# Patient Record
Sex: Male | Born: 1950
Health system: Southern US, Community
[De-identification: ages and names within clinical notes are randomized; demographics above are authoritative.]

## PROBLEM LIST (undated history)

## (undated) DIAGNOSIS — K5790 Diverticulosis of intestine, part unspecified, without perforation or abscess without bleeding: Secondary | ICD-10-CM

## (undated) DIAGNOSIS — I10 Essential (primary) hypertension: Secondary | ICD-10-CM

## (undated) DIAGNOSIS — I639 Cerebral infarction, unspecified: Secondary | ICD-10-CM

## (undated) DIAGNOSIS — K648 Other hemorrhoids: Secondary | ICD-10-CM

## (undated) DIAGNOSIS — E559 Vitamin D deficiency, unspecified: Secondary | ICD-10-CM

## (undated) DIAGNOSIS — E785 Hyperlipidemia, unspecified: Secondary | ICD-10-CM

## (undated) HISTORY — DX: Essential (primary) hypertension: I10

## (undated) HISTORY — PX: NASAL SEPTUM SURGERY: SHX37

## (undated) HISTORY — DX: Vitamin D deficiency, unspecified: E55.9

## (undated) HISTORY — PX: INGUINAL HERNIA REPAIR: SUR1180

## (undated) HISTORY — PX: TONSILLECTOMY: SUR1361

## (undated) HISTORY — DX: Diverticulosis of intestine, part unspecified, without perforation or abscess without bleeding: K57.90

## (undated) HISTORY — DX: Other hemorrhoids: K64.8

## (undated) HISTORY — DX: Hyperlipidemia, unspecified: E78.5

---
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
## 2006-08-28 HISTORY — PX: COLONOSCOPY: SHX174

## 2006-11-14 ENCOUNTER — Ambulatory Visit: Payer: Self-pay | Admitting: Gastroenterology

## 2006-11-28 ENCOUNTER — Ambulatory Visit: Payer: Self-pay | Admitting: Gastroenterology

## 2006-11-28 LAB — HM COLONOSCOPY

## 2010-11-25 ENCOUNTER — Telehealth: Payer: Self-pay | Admitting: Gastroenterology

## 2010-11-25 NOTE — Telephone Encounter (Signed)
Attempted to call pt back, phone number listed states it has been disconnected.

## 2010-11-28 ENCOUNTER — Telehealth: Payer: Self-pay | Admitting: Gastroenterology

## 2010-11-28 NOTE — Telephone Encounter (Signed)
I spoke with the patient and he believes he is due for a colon in 2013.  He wants to schedule now because he also has a hemorrhoid he would like to have surgically removed.   I  did explain to him that according to his procedure report from 11/2006, he would not be due for a colon until 2018.  He has no family hx of colon CA, no rectal bleeding or change in bowel habits.  He wanted to just then schedule a hemorrhoid removal.  I  explained that he would need to see a surgeon for this and not Dr Russella Dar. Dr Russella Dar I have placed a copy of the the 2008 colon report in your office please review and advise if different.

## 2010-11-29 NOTE — Telephone Encounter (Signed)
He can schedule an office visit to further evaluate his hemorrhoids with [place] or general surgery.

## 2010-11-29 NOTE — Telephone Encounter (Signed)
I spoke he declined to schedule at this time he will call back if he wants to schedule an appointment

## 2011-03-28 ENCOUNTER — Encounter: Payer: Self-pay | Admitting: Neurosurgery

## 2011-04-10 ENCOUNTER — Telehealth: Payer: Self-pay | Admitting: Gastroenterology

## 2011-04-10 NOTE — Telephone Encounter (Signed)
Left message for patient to call back  

## 2011-04-10 NOTE — Telephone Encounter (Signed)
Questions about hemorrhoids answered for the patient he will call back if he is interested in scheduling an office visit.

## 2011-04-21 ENCOUNTER — Other Ambulatory Visit: Payer: Self-pay | Admitting: Gastroenterology

## 2011-05-08 ENCOUNTER — Ambulatory Visit: Payer: Self-pay | Admitting: Gastroenterology

## 2012-07-04 ENCOUNTER — Telehealth: Payer: Self-pay | Admitting: Gastroenterology

## 2012-07-04 NOTE — Telephone Encounter (Signed)
All questions answered

## 2013-09-02 DIAGNOSIS — E785 Hyperlipidemia, unspecified: Secondary | ICD-10-CM | POA: Insufficient documentation

## 2013-09-02 DIAGNOSIS — I10 Essential (primary) hypertension: Secondary | ICD-10-CM | POA: Insufficient documentation

## 2013-09-02 DIAGNOSIS — E559 Vitamin D deficiency, unspecified: Secondary | ICD-10-CM | POA: Insufficient documentation

## 2013-09-03 ENCOUNTER — Ambulatory Visit (INDEPENDENT_AMBULATORY_CARE_PROVIDER_SITE_OTHER): Payer: BC Managed Care – PPO | Admitting: Emergency Medicine

## 2013-09-03 VITALS — BP 178/100 | HR 62 | Temp 98.0°F | Resp 20 | Ht 69.5 in | Wt 163.0 lb

## 2013-09-03 DIAGNOSIS — K469 Unspecified abdominal hernia without obstruction or gangrene: Secondary | ICD-10-CM

## 2013-09-03 DIAGNOSIS — E782 Mixed hyperlipidemia: Secondary | ICD-10-CM

## 2013-09-03 DIAGNOSIS — I10 Essential (primary) hypertension: Secondary | ICD-10-CM

## 2013-09-03 LAB — HEPATIC FUNCTION PANEL
ALBUMIN: 4.2 g/dL (ref 3.5–5.2)
ALT: 10 U/L (ref 0–53)
AST: 13 U/L (ref 0–37)
Alkaline Phosphatase: 67 U/L (ref 39–117)
BILIRUBIN TOTAL: 0.6 mg/dL (ref 0.3–1.2)
Bilirubin, Direct: 0.1 mg/dL (ref 0.0–0.3)
Indirect Bilirubin: 0.5 mg/dL (ref 0.0–0.9)
Total Protein: 6.4 g/dL (ref 6.0–8.3)

## 2013-09-03 LAB — CBC WITH DIFFERENTIAL/PLATELET
BASOS PCT: 0 % (ref 0–1)
Basophils Absolute: 0 10*3/uL (ref 0.0–0.1)
EOS PCT: 3 % (ref 0–5)
Eosinophils Absolute: 0.1 10*3/uL (ref 0.0–0.7)
HCT: 40.8 % (ref 39.0–52.0)
HEMOGLOBIN: 14.1 g/dL (ref 13.0–17.0)
Lymphocytes Relative: 19 % (ref 12–46)
Lymphs Abs: 0.9 10*3/uL (ref 0.7–4.0)
MCH: 30.5 pg (ref 26.0–34.0)
MCHC: 34.6 g/dL (ref 30.0–36.0)
MCV: 88.3 fL (ref 78.0–100.0)
MONO ABS: 0.4 10*3/uL (ref 0.1–1.0)
MONOS PCT: 8 % (ref 3–12)
NEUTROS PCT: 70 % (ref 43–77)
Neutro Abs: 3.4 10*3/uL (ref 1.7–7.7)
Platelets: 209 10*3/uL (ref 150–400)
RBC: 4.62 MIL/uL (ref 4.22–5.81)
RDW: 14 % (ref 11.5–15.5)
WBC: 4.8 10*3/uL (ref 4.0–10.5)

## 2013-09-03 LAB — LIPID PANEL
CHOL/HDL RATIO: 3.5 ratio
Cholesterol: 183 mg/dL (ref 0–200)
HDL: 53 mg/dL (ref >39)
LDL Cholesterol: 106 mg/dL — ABNORMAL HIGH (ref 0–99)
Triglycerides: 122 mg/dL (ref <150)
VLDL: 24 mg/dL (ref 0–40)

## 2013-09-03 LAB — BASIC METABOLIC PANEL WITH GFR
BUN: 13 mg/dL (ref 6–23)
CO2: 28 mEq/L (ref 19–32)
Calcium: 9.4 mg/dL (ref 8.4–10.5)
Chloride: 102 mEq/L (ref 96–112)
Creat: 0.88 mg/dL (ref 0.50–1.35)
GFR, Est Non African American: >89 mL/min
GLUCOSE: 86 mg/dL (ref 70–99)
POTASSIUM: 3.9 meq/L (ref 3.5–5.3)
Sodium: 137 mEq/L (ref 135–145)

## 2013-09-03 NOTE — Patient Instructions (Signed)
Hernia A hernia occurs when an internal organ pushes out through a weak spot in the abdominal wall. Hernias most commonly occur in the groin and around the navel. Hernias often can be pushed back into place (reduced). Most hernias tend to get worse over time. Some abdominal hernias can get stuck in the opening (irreducible or incarcerated hernia) and cannot be reduced. An irreducible abdominal hernia which is tightly squeezed into the opening is at risk for impaired blood supply (strangulated hernia). A strangulated hernia is a medical emergency. Because of the risk for an irreducible or strangulated hernia, surgery may be recommended to repair a hernia. CAUSES   Heavy lifting.  Prolonged coughing.  Straining to have a bowel movement.  A cut (incision) made during an abdominal surgery. HOME CARE INSTRUCTIONS   Bed rest is not required. You may continue your normal activities.  Avoid lifting more than 10 pounds (4.5 kg) or straining.  Cough gently. If you are a smoker it is best to stop. Even the best hernia repair can break down with the continual strain of coughing. Even if you do not have your hernia repaired, a cough will continue to aggravate the problem.  Do not wear anything tight over your hernia. Do not try to keep it in with an outside bandage or truss. These can damage abdominal contents if they are trapped within the hernia sac.  Eat a normal diet.  Avoid constipation. Straining over long periods of time will increase hernia size and encourage breakdown of repairs. If you cannot do this with diet alone, stool softeners may be used. SEEK IMMEDIATE MEDICAL CARE IF:   You have a fever.  You develop increasing abdominal pain.  You feel nauseous or vomit.  Your hernia is stuck outside the abdomen, looks discolored, feels hard, or is tender.  You have any changes in your bowel habits or in the hernia that are unusual for you.  You have increased pain or swelling around the  hernia.  You cannot push the hernia back in place by applying gentle pressure while lying down. MAKE SURE YOU:   Understand these instructions.  Will watch your condition.  Will get help right away if you are not doing well or get worse. Document Released: 08/14/2005 Document Revised: 11/06/2011 Document Reviewed: 04/02/2008 ExitCare Patient Information 2014 [employer].  

## 2013-09-03 NOTE — Progress Notes (Signed)
   Subjective:    Patient ID: [name], male    DOB: 01-09-1951, 63 y.o.   MRN: [id number]  HPI Comments: 63 yo male notes BP before coffee is 125/70s. He had 2 cups of coffee today. LAST LABS T 182 TG 69 L 111 D 50 he is exercising routinely. He eats healthy.   Patient has history of inguinal hernia that is doing well but now feels he may have another hernia around umbilicus after intense abdominal workout. He notes massage and rest have helped some. He notes all of abdomen was initially sore after work out but also noticed small bulge. He notes he is able to make bulge resolve with massage.  Current Outpatient Prescriptions on File Prior to Visit  Medication Sig Dispense Refill  . amLODipine (NORVASC) 5 MG tablet Take 5 mg by mouth daily.      . B Complex Vitamins (VITAMIN B COMPLEX PO) Take by mouth.      . carvedilol (COREG) 25 MG tablet Take 25 mg by mouth 2 (two) times daily with a meal.      . enalapril (VASOTEC) 20 MG tablet Take 20 mg by mouth daily. Taking 1/2      . vitamin C (ASCORBIC ACID) 500 MG tablet Take 500 mg by mouth daily.      Marland Kitchen VITAMIN E PO Take by mouth daily.       No current facility-administered medications on file prior to visit.   ALLERGIES Penicillins  Past Medical History  Diagnosis Date  . Diverticulosis   . Internal hemorrhoid   . Hyperlipidemia   . Hypertension   . Vitamin D deficiency       Review of Systems  Gastrointestinal: Positive for abdominal pain.  All other systems reviewed and are negative.   BP 178/100  Pulse 62  Temp(Src) 98 F (36.7 C) (Temporal)  Resp 20  Ht 5' 9.5" (1.765 m)  Wt 163 lb (73.936 kg)  BMI 23.73 kg/m2     Objective:   Physical Exam  Nursing note and vitals reviewed. Constitutional: He is oriented to person, place, and time. He appears well-developed and well-nourished.  HENT:  Head: Normocephalic and atraumatic.  Right Ear: External ear normal.  Left Ear: External ear normal.  Nose: Nose normal.   Eyes: Conjunctivae and EOM are normal.  Neck: Normal range of motion. Neck supple. No JVD present. No thyromegaly present.  Cardiovascular: Normal rate, regular rhythm, normal heart sounds and intact distal pulses.   Pulmonary/Chest: Effort normal and breath sounds normal.  Abdominal: Soft. Bowel sounds are normal. He exhibits no distension and no mass. There is no tenderness. There is no rebound and no guarding.  No obvious umbilical hernia, left inguinal small hernia easily reducible on exam  Musculoskeletal: Normal range of motion. He exhibits no edema and no tenderness.  Lymphadenopathy:    He has no cervical adenopathy.  Neurological: He is alert and oriented to person, place, and time. He has normal reflexes. No cranial nerve deficit. Coordination normal.  Skin: Skin is warm and dry.  Psychiatric: He has a normal mood and affect. His behavior is normal. Judgment and thought content normal.      Recheck 140/80    Assessment & Plan:  1.  3 month F/U for HTN, Cholesterol. Needs healthy diet, cardio QD and obtain healthy weight. Check Labs, Check BP if >130/80 call  2. ? Hernia- explained hygiene/ symptoms to monitor w/c with any problems/ concerns

## 2013-09-07 ENCOUNTER — Encounter: Payer: Self-pay | Admitting: Emergency Medicine

## 2013-09-16 ENCOUNTER — Telehealth: Payer: Self-pay | Admitting: *Deleted

## 2013-09-16 NOTE — Telephone Encounter (Signed)
Patient called and left message with front staff stating concerns with BP Rx's.  He c/o feeling increased fatigue and decreased pulse rate, but unsure which Rx causing symptoms.  Per orders by Kelby Aline, PA-C I advised patient to continue Rx's the same except to decrease Carvediol by 1/2 and take in the evenings.  Advised patient to call with results in one week, but follow up before then if needed.

## 2013-09-18 ENCOUNTER — Telehealth: Payer: Self-pay | Admitting: Internal Medicine

## 2013-09-18 NOTE — Telephone Encounter (Signed)
PT CALLED WITH QUESTIONS ON LABS. I TOOK A MESSAGE. CALLED PT TO [facility] TO DISCUSS QUESTIONS, AND PT ASKED IF PSA TEST WAS DONE, IF SO WHAT ARE THE RESULTS? PLEASE ADVISE PT

## 2013-09-26 ENCOUNTER — Telehealth: Payer: Self-pay | Admitting: Internal Medicine

## 2013-09-26 ENCOUNTER — Telehealth: Payer: Self-pay

## 2013-09-26 NOTE — Telephone Encounter (Signed)
Pt called c/o being weak, no energy, chills, runny nose. No fever, nausea, or body aches. 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 He didn't want to wait until Mon to call. Please advise. [facility]

## 2013-09-26 NOTE — Telephone Encounter (Signed)
Thank you for calling and advising him to push fluids and try OTC theraflu. Urgent care/ ER if increased symptoms.

## 2013-09-26 NOTE — Telephone Encounter (Signed)
PATIENT CALLED AFTER HOURS TO MELISSA SMITH,P.A. REQUESTING TAMIFLU.  I CALLED PATIENT PER MISS SMITH AND ADVISED PATIENT TO INTAKE MORE FLUIDS AND TRY THERAFLU.  IF SYMTOMS PRESIST GO TO A MINUTE CLINIC OVER THE WEEKEND, IF HE CAN NOT WAIT UNTIL Monday.

## 2013-10-25 ENCOUNTER — Other Ambulatory Visit: Payer: Self-pay | Admitting: Internal Medicine

## 2014-01-15 ENCOUNTER — Other Ambulatory Visit: Payer: Self-pay | Admitting: Internal Medicine

## 2014-03-10 ENCOUNTER — Encounter: Payer: Self-pay | Admitting: Emergency Medicine

## 2014-03-10 ENCOUNTER — Ambulatory Visit (INDEPENDENT_AMBULATORY_CARE_PROVIDER_SITE_OTHER): Payer: BC Managed Care – PPO | Admitting: Emergency Medicine

## 2014-03-10 VITALS — BP 140/82 | HR 62 | Temp 97.8°F | Resp 16 | Ht 69.5 in | Wt 166.0 lb

## 2014-03-10 DIAGNOSIS — I10 Essential (primary) hypertension: Secondary | ICD-10-CM

## 2014-03-10 NOTE — Patient Instructions (Signed)
Squamous Cell Carcinoma  Squamous cell carcinoma is the second most common form of skin cancer. It begins in the squamous cells in the outer layer of the skin (epidermis).  CAUSES  Ultraviolet light exposure is the most common cause of squamous cell carcinoma. This may come from sunlight or tanning beds. Squamous cell carcinoma is most common in sun-exposed areas like the face, neck, arms, and hands. However, squamous cell carcinoma can occur anywhere on the body, including the lips, inside the mouth, the legs, sites of long-term (chronic) scarring, and the anus.  Other causes of squamous cell carcinoma can include:  Exposure to arsenic.  Exposure to radiation.  Exposure to toxic tars and oils. RISK FACTORS Factors that increase your risk for squamous cell carcinoma include:  Having fair skin.  Being middle-aged or elderly.  Heavy sun exposure, especially during childhood.  Repeated sunburns.  Use of tanning beds.  A weakened immune system. This includes patients who have received a transplant and patients with human immunodeficiency virus (HIV) or acquired immunodeficency syndrome (AIDS).  Human papillomavirus infection.  Conditions that cause chronic scarring. This can include burn scars, chronic ulcers, heat (thermal) injuries, and radiation.  Exposure to psoralen plus ultraviolet A light therapy.  Exposure to chemical carcinogens, such as tar, soot, and arsenic.  Chronic, inflammatory conditions such as lupus, lichen planus, or lichen sclerosus.  Chronic infections, such as infections of the bone (osteomyelitis).  Smoking. SYMPTOMS  Squamous cell carcinoma often starts as small, skin-colored (pink or brown) sandpaper-like growths. These growths are called solar keratoses or actinic keratoses. These growths are often more easily felt than seen.  DIAGNOSIS  Your caregiver may be able to tell what is wrong by doing a physical exam. Often, a tissue sample is also taken. The  tissue sample is examined under a microscope.  TREATMENT  The treatment for squamous cell carcinoma depends on the size and location of the tumors, as well as your overall health. Possible treatments include:   Mohs surgery. This is a procedure done by a skin doctor (dermatologist or Mohs surgeon) in his or her office. The cancerous cells are removed layer by layer.  Laser surgery to remove the tumor.  Freezing the tumor with liquid nitrogen (cryosurgery).  Radiation. This may be used for tumors on the face.  Electrodesiccation and curettage. This involves alternately scraping and burning the tumor, using an electric current to control bleeding. If treated soon enough, squamous cell carcinoma rarely spreads to other areas of the body (metastasizes). If left untreated, however, squamous cell carcinoma will destroy the nearby tissues. This can result in the loss of a nose or ear. PREVENTION  Avoid the sun between 10:00 am and 4:00 pm when it is the strongest.  Use a sunscreen or sunblock with sun protection factor 30 or greater.  Apply sunscreen at least 30 minutes before exposure to the sun.  Reapply sunscreen every 2 to 4 hours while you are outside, after swimming, and after excessive sweating.  Always wear protective hats, clothing, and sunglasses with ultraviolet protection.  Avoid tanning beds. HOME CARE INSTRUCTIONS   Avoid unprotected sun exposure.  Do not smoke.  Follow your caregiver's instructions for self-exams. Look for new growths or changes in your skin.  Keep all follow-up appointments as directed by your caregiver. SEEK MEDICAL CARE IF:   You notice any new growths or changes in your skin.  You have had a squamous cell carcinoma tumor removed and you notice a new growth in   your skin.  · Keep all follow-up appointments as directed by your caregiver.  SEEK MEDICAL CARE IF:   · You notice any new growths or changes in your skin.  · You have had a squamous cell carcinoma tumor removed and you notice a new growth in the same location.  Document Released: 02/18/2003 Document Revised: 11/06/2011 Document Reviewed: 05/08/2011  ExitCare® Patient Information ©2015 [employer]. This information is not  intended to replace advice given to you by your health care provider. Make sure you discuss any questions you have with your health care provider.

## 2014-03-10 NOTE — Progress Notes (Signed)
Subjective:    Patient ID: [name], male    DOB: 24-Oct-1950, 63 y.o.   MRN: [id number]  HPI Comments: 63 yo WM presents for 3 month F/U for HTN,  He is exercising routinely. He is eating healthy for the most part. He has gained some weight. He notes BP good at home. He has CPE in August.  He has mole on right humerus that is not healing. He will make appointment derm for removal but wants our opinion 1st.  WBC             4.8   09/03/2013 HGB            14.1   09/03/2013 HCT            40.8   09/03/2013 PLT             209   09/03/2013 GLUCOSE          86   09/03/2013 CHOL            183   09/03/2013 TRIG            122   09/03/2013 HDL              53   09/03/2013 LDLCALC         106   09/03/2013 ALT              10   09/03/2013 AST              13   09/03/2013 NA              137   09/03/2013 K               3.9   09/03/2013 CL              102   09/03/2013 CREATININE     0.88   09/03/2013 BUN              13   09/03/2013 CO2              28   09/03/2013     Medication List       This list is accurate as of: 03/10/14 11:21 AM.  Always use your most recent med list.               amLODipine 5 MG tablet  Commonly known as:  NORVASC  Take 5 mg by mouth daily.     carvedilol 25 MG tablet  Commonly known as:  COREG  TAKE ONE TABLET BY MOUTH TWICE DAILY FOR  BLOOD  PRESSURE     enalapril 20 MG tablet  Commonly known as:  VASOTEC  TAKE ONE TABLET BY MOUTH TWICE DAILY FOR  BLOOD  PRESSURE     VITAMIN B COMPLEX PO  Take by mouth.     vitamin C 500 MG tablet  Commonly known as:  ASCORBIC ACID  Take 500 mg by mouth daily.     VITAMIN E PO  Take by mouth daily.       Allergies  Allergen Reactions  . Penicillins    Past Medical History  Diagnosis Date  . Diverticulosis   . Internal hemorrhoid   . Hyperlipidemia   . Hypertension   . Vitamin D deficiency       Review of Systems  Skin: Positive for color change and wound.  All other systems reviewed and are negative.  BP 140/82  Pulse 62  Temp(Src) 97.8 F (36.6 C) (Temporal)  Resp 16  Ht 5' 9.5" (1.765 m)  Wt 166 lb (75.297 kg)  BMI 24.17 kg/m2     Objective:   Physical Exam  Nursing note and vitals reviewed. Constitutional: He is oriented to person, place, and time. He appears well-developed and well-nourished.  HENT:  Head: Normocephalic and atraumatic.  Right Ear: External ear normal.  Left Ear: External ear normal.  Nose: Nose normal.  Eyes: Conjunctivae are normal.  Neck: Normal range of motion.  Cardiovascular: Normal rate, regular rhythm, normal heart sounds and intact distal pulses.   Pulmonary/Chest: Effort normal and breath sounds normal.  Abdominal: Soft.  Musculoskeletal: Normal range of motion.  Lymphadenopathy:    He has no cervical adenopathy.  Neurological: He is alert and oriented to person, place, and time.  Skin: Skin is warm and dry.  Left humerus 3 mm erythema elevation scaling  Psychiatric: He has a normal mood and affect. Judgment normal.          Assessment & Plan:  1. HTN- Check BP call if >130/80, increase cardio, declines labs will get AT CPE   2. Irreg Nevi- monitor for any change, call DERM for removal with DR Ronnald Ramp

## 2014-03-11 ENCOUNTER — Ambulatory Visit: Payer: Self-pay | Admitting: Emergency Medicine

## 2014-03-14 ENCOUNTER — Other Ambulatory Visit: Payer: Self-pay | Admitting: Physician Assistant

## 2014-03-16 ENCOUNTER — Other Ambulatory Visit: Payer: Self-pay

## 2014-03-16 MED ORDER — CARVEDILOL 25 MG PO TABS: 25.0000 mg | ORAL_TABLET | Freq: Two times a day (BID) | ORAL | Status: DC

## 2014-03-23 ENCOUNTER — Telehealth: Payer: Self-pay | Admitting: *Deleted

## 2014-03-23 MED ORDER — BISOPROLOL-HYDROCHLOROTHIAZIDE 10-6.25 MG PO TABS: 1.0000 | ORAL_TABLET | Freq: Every day | ORAL | Status: DC

## 2014-03-23 NOTE — Telephone Encounter (Signed)
Patient called and left message with the front staff stating concerns about BP rx's and negative side effects of headaches.  Patient was taking Norvasc, Coreg and Enalapril prescribed by Dr. Rachel Moulds, but when patient had ov in 12/2011 with Dr. Melford Aase he discussed concerns about being "over medicated" resulting in negative side effects.  Patient requested discontinuing some of his BP rx's and per Dr. Melford Aase patient was advised to d/c all BP rx's and start Ziac 10-6.25 mg.  Patient states he became "paranoid" and did not d/c rx's as directed, nor did he start Ziac rx as directed.  Patient called today requesting he d/c current BP rx's (Norvasc, Coreg, and Enalapril) and try Dr. Idell Pickles original suggestions of Ziac rx.  Per Dr. Idell Pickles orders ok for patient to d/c current BP rx's and new rx for Ziac will be sent into [facility].

## 2014-03-25 ENCOUNTER — Encounter: Payer: Self-pay | Admitting: Emergency Medicine

## 2014-04-16 ENCOUNTER — Encounter: Payer: Self-pay | Admitting: Physician Assistant

## 2014-04-24 ENCOUNTER — Other Ambulatory Visit: Payer: Self-pay

## 2014-04-24 ENCOUNTER — Other Ambulatory Visit: Payer: Self-pay | Admitting: Internal Medicine

## 2014-04-24 MED ORDER — ENALAPRIL MALEATE 20 MG PO TABS: 20.0000 mg | ORAL_TABLET | Freq: Two times a day (BID) | ORAL | Status: DC

## 2014-04-24 MED ORDER — AMLODIPINE BESYLATE 5 MG PO TABS: 5.0000 mg | ORAL_TABLET | Freq: Every day | ORAL | Status: DC

## 2014-04-24 NOTE — Telephone Encounter (Signed)
Patient called and wanted to stop Ziac once daily and go back to the three meds he was previously taking, The Carvedilol, Enalapril, and Norvasc. Patient states that his blood pressure has been averaging 150/90 and on the other three it was a lot better, averaging 120's over 70's most days. He has a supply of Carvedilol and only needs the Norvasc and Enalapril called in to [facility] [place] , ok to call in and have patient go back to previous medications, he is aware to monitor blood pressures. Patient verified dosage and amount taking correctly. Patient request 90 days supply, ok per Vicie Mutters, [place] , RX sent

## 2014-04-30 ENCOUNTER — Other Ambulatory Visit: Payer: Self-pay | Admitting: Physician Assistant

## 2014-05-01 NOTE — Telephone Encounter (Signed)
Left message on machine for pt to return call.

## 2014-05-05 NOTE — Telephone Encounter (Signed)
Spoke with patient and he states his girlfriend has UTI.  Patient was requesting this abx simply for coverage for himself.  I advised him we would need to schedule a NV/lab to check UA, C&S to determine if abx is necessary.  Patient denies any symptoms and declines lab at this time, states he will call back for appointment if symptoms occur.

## 2014-05-27 ENCOUNTER — Encounter: Payer: Self-pay | Admitting: Physician Assistant

## 2014-06-22 ENCOUNTER — Encounter: Payer: Self-pay | Admitting: Physician Assistant

## 2014-08-12 ENCOUNTER — Encounter: Payer: Self-pay | Admitting: Physician Assistant

## 2014-08-12 ENCOUNTER — Ambulatory Visit (INDEPENDENT_AMBULATORY_CARE_PROVIDER_SITE_OTHER): Payer: BC Managed Care – PPO | Admitting: Physician Assistant

## 2014-08-12 VITALS — BP 140/80 | HR 60 | Temp 98.2°F | Resp 16 | Ht 69.5 in | Wt 170.0 lb

## 2014-08-12 DIAGNOSIS — Z79899 Other long term (current) drug therapy: Secondary | ICD-10-CM

## 2014-08-12 DIAGNOSIS — I10 Essential (primary) hypertension: Secondary | ICD-10-CM

## 2014-08-12 DIAGNOSIS — R001 Bradycardia, unspecified: Secondary | ICD-10-CM

## 2014-08-12 DIAGNOSIS — E785 Hyperlipidemia, unspecified: Secondary | ICD-10-CM

## 2014-08-12 DIAGNOSIS — Z125 Encounter for screening for malignant neoplasm of prostate: Secondary | ICD-10-CM

## 2014-08-12 DIAGNOSIS — Z1212 Encounter for screening for malignant neoplasm of rectum: Secondary | ICD-10-CM

## 2014-08-12 DIAGNOSIS — N400 Enlarged prostate without lower urinary tract symptoms: Secondary | ICD-10-CM

## 2014-08-12 DIAGNOSIS — L989 Disorder of the skin and subcutaneous tissue, unspecified: Secondary | ICD-10-CM

## 2014-08-12 DIAGNOSIS — E559 Vitamin D deficiency, unspecified: Secondary | ICD-10-CM

## 2014-08-12 LAB — HEPATIC FUNCTION PANEL
ALBUMIN: 4.2 g/dL (ref 3.5–5.2)
ALK PHOS: 63 U/L (ref 39–117)
ALT: 13 U/L (ref 0–53)
AST: 16 U/L (ref 0–37)
Bilirubin, Direct: 0.1 mg/dL (ref 0.0–0.3)
Indirect Bilirubin: 0.7 mg/dL (ref 0.2–1.2)
Total Bilirubin: 0.8 mg/dL (ref 0.2–1.2)
Total Protein: 6.7 g/dL (ref 6.0–8.3)

## 2014-08-12 LAB — LIPID PANEL
Cholesterol: 176 mg/dL (ref 0–200)
HDL: 48 mg/dL (ref >39)
LDL Cholesterol: 105 mg/dL — ABNORMAL HIGH (ref 0–99)
Total CHOL/HDL Ratio: 3.7 Ratio
Triglycerides: 117 mg/dL (ref <150)
VLDL: 23 mg/dL (ref 0–40)

## 2014-08-12 LAB — BASIC METABOLIC PANEL WITH GFR
BUN: 13 mg/dL (ref 6–23)
CALCIUM: 9.6 mg/dL (ref 8.4–10.5)
CO2: 29 meq/L (ref 19–32)
CREATININE: 0.85 mg/dL (ref 0.50–1.35)
Chloride: 98 mEq/L (ref 96–112)
GFR, Est African American: >89 mL/min
Glucose, Bld: 85 mg/dL (ref 70–99)
Potassium: 3.6 mEq/L (ref 3.5–5.3)
Sodium: 137 mEq/L (ref 135–145)

## 2014-08-12 LAB — MAGNESIUM: Magnesium: 2.3 mg/dL (ref 1.5–2.5)

## 2014-08-12 MED ORDER — DOXAZOSIN MESYLATE 8 MG PO TABS: 8.0000 mg | ORAL_TABLET | Freq: Every day | ORAL | Status: DC

## 2014-08-12 NOTE — Addendum Note (Signed)
Addended by: Vicie Mutters R on: 08/12/2014 03:37 PM   Modules accepted: Miquel Dunn

## 2014-08-12 NOTE — Progress Notes (Signed)
Complete Physical  Assessment and Plan: 1. Essential hypertension Stop the coreg due to symptomatic brady, start doxazosin for BPH, monitor BP - continue medications, DASH diet, exercise and monitor at home. Call if greater than 130/80.  - CBC with Differential - BASIC METABOLIC PANEL WITH GFR - Hepatic function panel - TSH - Urinalysis, Routine w reflex microscopic - Microalbumin / creatinine urine ratio - EKG 12-Lead - [place], RETROPERITNL ABD,  LTD  2. Hyperlipidemia -continue medications, check lipids, decrease fatty foods, increase activity.  - Lipid panel  3. Vitamin D deficiency - Vit D  25 hydroxy (rtn osteoporosis monitoring)  4. Medication management - Magnesium  5. Prostate cancer screening Hemoccult negative in office - PSA  6. Screening for rectal cancer - POC Hemoccult Bld/Stl (3-Cd Home Screen); Future  7. Abnormality, skin Follow up with Dr. Ronnald Ramp  8. BPH (benign prostatic hyperplasia) Start the doxazosin per orders, check PSA, normal prostate exam  9. Sinus bradycardia Stop the coreg and start doxazosin as discussed  Suggest seeing eye doctor.  Discussed med's effects and SE's. Screening labs and tests as requested with regular follow-up as recommended.  HPI Patient presents for a complete physical.    His blood pressure has been controlled at home, checks 2-3 times a week, states he is nervous when he comes in, he is on norvasc 5mg , enalapril 20mg  and coreg 25mg  take 1/2 BID, today their BP is BP: 140/80 mmHg He does workout. He denies chest pain, shortness of breath, dizziness.  He is not on cholesterol medication and denies myalgias. His cholesterol is at goal. The cholesterol last visit was:   Lab Results  Component Value Date   CHOL 183 09/03/2013   HDL 53 09/03/2013   LDLCALC 106* 09/03/2013   TRIG 122 09/03/2013   CHOLHDL 3.5 09/03/2013    Last A1C in the office was: 5.0 Patient is on Vitamin D supplement.   He teaches yoga.  Last PSA  was: 1.10 He is having some difficulty initiating stream, some urinary frequency.  Patient non smoker, family with heavy history of smoking and history of carotid problems requesting [place]. Denies any symptoms. Explained that he is very low risk and guidelines states to not get [place]. Patient agrees.   Current Medications:  Current Outpatient Prescriptions on File Prior to Visit  Medication Sig Dispense Refill  . amLODipine (NORVASC) 5 MG tablet Take 1 tablet (5 mg total) by mouth daily. 90 tablet 1  . B Complex Vitamins (VITAMIN B COMPLEX PO) Take by mouth.    . carvedilol (COREG) 25 MG tablet Take 25 mg by mouth 2 (two) times daily with a meal.    . enalapril (VASOTEC) 20 MG tablet Take 1 tablet (20 mg total) by mouth 2 (two) times daily. 180 tablet 1  . vitamin C (ASCORBIC ACID) 500 MG tablet Take 500 mg by mouth daily.    Marland Kitchen VITAMIN E PO Take by mouth daily.     No current facility-administered medications on file prior to visit.   Health Maintenance:  Immunization History  Administered Date(s) Administered  . Influenza-Unspecified 05/17/2014  . Td 11/15/2007   Tetanus: 2009 Pneumovax: declines Flu vaccine: 2015 Zostavax: declines DEXA: Colonoscopy:2008 due 2018, would like done sooner  EGD: Eye: no doctor Dentist: Dr. Jarrett Soho  Patient Care Team: Unk Pinto, MD as PCP - General (Internal Medicine) Ladene Artist, MD as Consulting Physician (Gastroenterology) Agapito Games (Optometry) Jarome Matin, MD as Consulting Physician (Dermatology)  Allergies:  Allergies  Allergen  Reactions  . Penicillins    Medical History:  Past Medical History  Diagnosis Date  . Diverticulosis   . Internal hemorrhoid   . Hyperlipidemia   . Hypertension   . Vitamin D deficiency    Surgical History:  Past Surgical History  Procedure Laterality Date  . Tonsillectomy    . Inguinal hernia repair    . Nasal septum surgery     Family History: No family history on file. Social  History:   History  Substance Use Topics  . Smoking status: Former Smoker    Quit date: 08/29/1971  . Smokeless tobacco: Never Used  . Alcohol Use: No    Review of Systems:  Review of Systems  Constitutional: Positive for malaise/fatigue (worse after taking coreg). Negative for fever, chills, weight loss and diaphoresis.  HENT: Positive for hearing loss. Negative for congestion, ear discharge, ear pain, nosebleeds, sore throat and tinnitus.   Eyes: Negative.   Respiratory: Negative.  Negative for stridor.   Cardiovascular: Negative.   Genitourinary: Positive for urgency and frequency. Negative for dysuria, hematuria and flank pain.  Musculoskeletal: Positive for joint pain (fingers). Negative for myalgias, back pain, falls and neck pain.  Skin: Negative.   Neurological: Negative.  Negative for weakness and headaches.  Psychiatric/Behavioral: Negative.     Physical Exam: Estimated body mass index is 24.75 kg/(m^2) as calculated from the following:   Height as of this encounter: 5' 9.5" (1.765 m).   Weight as of this encounter: 170 lb (77.111 kg). BP 140/80 mmHg  Pulse 60  Temp(Src) 98.2 F (36.8 C)  Resp 16  Ht 5' 9.5" (1.765 m)  Wt 170 lb (77.111 kg)  BMI 24.75 kg/m2 General Appearance: Well nourished, in no apparent distress.  Eyes: PERRLA, EOMs, conjunctiva no swelling or erythema, normal fundi and vessels.  Sinuses: No Frontal/maxillary tenderness  ENT/Mouth: Ext aud canals clear, normal light reflex with TMs without erythema, bulging. Good dentition. No erythema, swelling, or exudate on post pharynx. Tonsils not swollen or erythematous. Hearing normal.  Neck: Supple, thyroid normal. No bruits.  Respiratory: Respiratory effort normal, BS equal bilaterally without rales, rhonchi, wheezing or stridor.  Cardio: RRR without murmurs, rubs or gallops. Brisk peripheral pulses without edema.  Chest: symmetric, with normal excursions and percussion.  Abdomen: Soft, nontender, no  guarding, rebound, hernias, masses, or organomegaly. .  Lymphatics: Non tender without lymphadenopathy.  Genitourinary: + external hemorrhoid. Slightly boggy, enlarged prostate without nodules/hardness, neg hemoccult in office. Musculoskeletal: Full ROM all peripheral extremities,5/5 strength, and normal gait.  Skin: Warm, dry without rashes, lesions, ecchymosis. Right ear with scaly, erythematous area.  Neuro: Cranial nerves intact, reflexes equal bilaterally. Normal muscle tone, no cerebellar symptoms. Sensation intact.  Psych: Awake and oriented X 3, normal affect, Insight and Judgment appropriate.   EKG: Sinus brady, WNL no changes. AORTA SCAN: WNL  Vicie Mutters 2:41 PM [facility]

## 2014-08-12 NOTE — Patient Instructions (Addendum)
Stop the carvedilol and start the doxazosin.   Start 1/4 of the doxazosin at night before bed for 1 week, then do 1/2 of the doxazosin at night for 1 week. Stay on the doxazosin or increase to 1 pill. If you get dizzy with the doxazosin go back to the previous dose. This medication can cause hypotension so be careful with standing in the beginning and decrease the dose if you get dizzy. Please drink plenty of fluids.   Preventative Care for Adults, Male       REGULAR HEALTH EXAMS:  A routine yearly physical is a good way to check in with your primary care provider about your health and preventive screening. It is also an opportunity to share updates about your health and any concerns you have, and receive a thorough all-over exam.   Most health insurance companies pay for at least some preventative services.  Check with your health plan for specific coverages.  WHAT PREVENTATIVE SERVICES DO MEN NEED?  Adult men should have their weight and blood pressure checked regularly.   Men age 65 and older should have their cholesterol levels checked regularly.  Beginning at age 65 and continuing to age 60, men should be screened for colorectal cancer.  Certain people should may need continued testing until age 79.  Other cancer screening may include exams for testicular and prostate cancer.  Updating vaccinations is part of preventative care.  Vaccinations help protect against diseases such as the flu.  Lab tests are generally done as part of preventative care to screen for anemia and blood disorders, to screen for problems with the kidneys and liver, to screen for bladder problems, to check blood sugar, and to check your cholesterol level.  Preventative services generally include counseling about diet, exercise, avoiding tobacco, drugs, excessive alcohol consumption, and sexually transmitted infections.    GENERAL RECOMMENDATIONS FOR GOOD HEALTH:  Healthy diet:  Eat a variety of foods,  including fruit, vegetables, animal or vegetable protein, such as meat, fish, chicken, and eggs, or beans, lentils, tofu, and grains, such as rice.  Drink plenty of water daily.  Decrease saturated fat in the diet, avoid lots of red meat, processed foods, sweets, fast foods, and fried foods.  Exercise:  Aerobic exercise helps maintain good heart health. At least 30-40 minutes of moderate-intensity exercise is recommended. For example, a brisk walk that increases your heart rate and breathing. This should be done on most days of the week.   Find a type of exercise or a variety of exercises that you enjoy so that it becomes a part of your daily life.  Examples are running, walking, swimming, water aerobics, and biking.  For motivation and support, explore group exercise such as aerobic class, spin class, Zumba, Yoga,or  martial arts, etc.    Set exercise goals for yourself, such as a certain weight goal, walk or run in a race such as a 5k walk/run.  Speak to your primary care provider about exercise goals.  Disease prevention:  If you smoke or chew tobacco, find out from your caregiver how to quit. It can literally save your life, no matter how long you have been a tobacco user. If you do not use tobacco, never begin.   Maintain a healthy diet and normal weight. Increased weight leads to problems with blood pressure and diabetes.   The Body Mass Index or BMI is a way of measuring how much of your body is fat. Having a BMI above 27 increases  the risk of heart disease, diabetes, hypertension, stroke and other problems related to obesity. Your caregiver can help determine your BMI and based on it develop an exercise and dietary program to help you achieve or maintain this important measurement at a healthful level.  High blood pressure causes heart and blood vessel problems.  Persistent high blood pressure should be treated with medicine if weight loss and exercise do not work.   Fat and  cholesterol leaves deposits in your arteries that can block them. This causes heart disease and vessel disease elsewhere in your body.  If your cholesterol is found to be high, or if you have heart disease or certain other medical conditions, then you may need to have your cholesterol monitored frequently and be treated with medication.   Ask if you should have a stress test if your history suggests this. A stress test is a test done on a treadmill that looks for heart disease. This test can find disease prior to there being a problem.  Avoid drinking alcohol in excess (more than two drinks per day).  Avoid use of street drugs. Do not share needles with anyone. Ask for professional help if you need assistance or instructions on stopping the use of alcohol, cigarettes, and/or drugs.  Brush your teeth twice a day with fluoride toothpaste, and floss once a day. Good oral hygiene prevents tooth decay and gum disease. The problems can be painful, unattractive, and can cause other health problems. Visit your dentist for a routine oral and dental check up and preventive care every 6-12 months.   Look at your skin regularly.  Use a mirror to look at your back. Notify your caregivers of changes in moles, especially if there are changes in shapes, colors, a size larger than a pencil eraser, an irregular border, or development of new moles.  Safety:  Use seatbelts 100% of the time, whether driving or as a passenger.  Use safety devices such as hearing protection if you work in environments with loud noise or significant background noise.  Use safety glasses when doing any work that could send debris in to the eyes.  Use a helmet if you ride a bike or motorcycle.  Use appropriate safety gear for contact sports.  Talk to your caregiver about gun safety.  Use sunscreen with a SPF (or skin protection factor) of 15 or greater.  Lighter skinned people are at a greater risk of skin cancer. Don't forget to also wear  sunglasses in order to protect your eyes from too much damaging sunlight. Damaging sunlight can accelerate cataract formation.   Practice safe sex. Use condoms. Condoms are used for birth control and to help reduce the spread of sexually transmitted infections (or STIs).  Some of the STIs are gonorrhea (the clap), chlamydia, syphilis, trichomonas, herpes, HPV (human papilloma virus) and HIV (human immunodeficiency virus) which causes AIDS. The herpes, HIV and HPV are viral illnesses that have no cure. These can result in disability, cancer and death.   Keep carbon monoxide and smoke detectors in your home functioning at all times. Change the batteries every 6 months or use a model that plugs into the wall.   Vaccinations:  Stay up to date with your tetanus shots and other required immunizations. You should have a booster for tetanus every 10 years. Be sure to get your flu shot every year, since 5%-20% of the [place] population comes down with the flu. The flu vaccine changes each year, so being vaccinated once  is not enough. Get your shot in the fall, before the flu season peaks.   Other vaccines to consider:  Pneumococcal vaccine to protect against certain types of pneumonia.  This is normally recommended for adults age 41 or older.  However, adults younger than 65 years old with certain underlying conditions such as diabetes, heart or lung disease should also receive the vaccine.  Shingles vaccine to protect against Varicella Zoster if you are older than age 4, or younger than 65 years old with certain underlying illness.  Hepatitis A vaccine to protect against a form of infection of the liver by a virus acquired from food.  Hepatitis B vaccine to protect against a form of infection of the liver by a virus acquired from blood or body fluids, particularly if you work in health care.  If you plan to travel internationally, check with your local health department for specific vaccination  recommendations.  Cancer Screening:  Most routine colon cancer screening begins at the age of 59. On a yearly basis, doctors may provide special easy to use take-home tests to check for hidden blood in the stool. Sigmoidoscopy or colonoscopy can detect the earliest forms of colon cancer and is life saving. These tests use a small camera at the end of a tube to directly examine the colon. Speak to your caregiver about this at age 21, when routine screening begins (and is repeated every 5 years unless early forms of pre-cancerous polyps or small growths are found).   At the age of 59 men usually start screening for prostate cancer every year. Screening may begin at a younger age for those with higher risk. Those at higher risk include African-Americans or having a family history of prostate cancer. There are two types of tests for prostate cancer:   Prostate-specific antigen (PSA) testing. Recent studies raise questions about prostate cancer using PSA and you should discuss this with your caregiver.   Digital rectal exam (in which your doctor's lubricated and gloved finger feels for enlargement of the prostate through the anus).   Screening for testicular cancer.  Do a monthly exam of your testicles. Gently roll each testicle between your thumb and fingers, feeling for any abnormal lumps. The best time to do this is after a hot shower or bath when the tissues are looser. Notify your caregivers of any lumps, tenderness or changes in size or shape immediately.

## 2014-08-13 LAB — URINALYSIS, ROUTINE W REFLEX MICROSCOPIC
Bilirubin Urine: NEGATIVE
Glucose, UA: NEGATIVE mg/dL
Hgb urine dipstick: NEGATIVE
Ketones, ur: NEGATIVE mg/dL
LEUKOCYTES UA: NEGATIVE
NITRITE: NEGATIVE
Protein, ur: NEGATIVE mg/dL
SPECIFIC GRAVITY, URINE: 1.021 (ref 1.005–1.030)
UROBILINOGEN UA: 0.2 mg/dL (ref 0.0–1.0)
pH: 6 (ref 5.0–8.0)

## 2014-08-13 LAB — CBC WITH DIFFERENTIAL/PLATELET
BASOS ABS: 0 10*3/uL (ref 0.0–0.1)
Basophils Relative: 0 % (ref 0–1)
EOS ABS: 0.1 10*3/uL (ref 0.0–0.7)
EOS PCT: 2 % (ref 0–5)
HCT: 40.7 % (ref 39.0–52.0)
Hemoglobin: 14.3 g/dL (ref 13.0–17.0)
LYMPHS PCT: 18 % (ref 12–46)
Lymphs Abs: 1.3 10*3/uL (ref 0.7–4.0)
MCH: 30.8 pg (ref 26.0–34.0)
MCHC: 35.1 g/dL (ref 30.0–36.0)
MCV: 87.7 fL (ref 78.0–100.0)
MPV: 9.6 fL (ref 9.4–12.4)
Monocytes Absolute: 0.6 10*3/uL (ref 0.1–1.0)
Monocytes Relative: 9 % (ref 3–12)
Neutro Abs: 5 10*3/uL (ref 1.7–7.7)
Neutrophils Relative %: 71 % (ref 43–77)
PLATELETS: 219 10*3/uL (ref 150–400)
RBC: 4.64 MIL/uL (ref 4.22–5.81)
RDW: 13.9 % (ref 11.5–15.5)
WBC: 7 10*3/uL (ref 4.0–10.5)

## 2014-08-13 LAB — VITAMIN D 25 HYDROXY (VIT D DEFICIENCY, FRACTURES): VIT D 25 HYDROXY: 35 ng/mL (ref 30–100)

## 2014-08-13 LAB — MICROALBUMIN / CREATININE URINE RATIO
Creatinine, Urine: 148.6 mg/dL
MICROALB UR: 1.1 mg/dL (ref <2.0)
MICROALB/CREAT RATIO: 7.4 mg/g (ref 0.0–30.0)

## 2014-08-13 LAB — PSA: PSA: 1.03 ng/mL (ref <=4.00)

## 2014-08-13 LAB — TSH: TSH: 1.269 u[IU]/mL (ref 0.350–4.500)

## 2014-08-17 ENCOUNTER — Encounter: Payer: Self-pay | Admitting: Physician Assistant

## 2014-08-20 ENCOUNTER — Ambulatory Visit: Payer: Self-pay | Admitting: Internal Medicine

## 2014-08-24 ENCOUNTER — Other Ambulatory Visit (INDEPENDENT_AMBULATORY_CARE_PROVIDER_SITE_OTHER): Payer: BC Managed Care – PPO

## 2014-08-24 DIAGNOSIS — Z1212 Encounter for screening for malignant neoplasm of rectum: Secondary | ICD-10-CM

## 2014-08-24 LAB — POC HEMOCCULT BLD/STL (HOME/3-CARD/SCREEN)
FECAL OCCULT BLD: NEGATIVE
FECAL OCCULT BLD: NEGATIVE
FECAL OCCULT BLD: NEGATIVE

## 2014-08-31 ENCOUNTER — Ambulatory Visit: Payer: Self-pay | Admitting: Internal Medicine

## 2014-10-19 ENCOUNTER — Ambulatory Visit: Payer: Self-pay | Admitting: Internal Medicine

## 2014-10-27 ENCOUNTER — Other Ambulatory Visit: Payer: Self-pay | Admitting: Physician Assistant

## 2015-02-17 ENCOUNTER — Ambulatory Visit: Payer: Self-pay | Admitting: Physician Assistant

## 2015-03-05 ENCOUNTER — Ambulatory Visit (INDEPENDENT_AMBULATORY_CARE_PROVIDER_SITE_OTHER): Payer: Commercial Managed Care - HMO | Admitting: Physician Assistant

## 2015-03-05 ENCOUNTER — Encounter: Payer: Self-pay | Admitting: Physician Assistant

## 2015-03-05 VITALS — BP 160/92 | HR 60 | Temp 97.7°F | Resp 16 | Ht 69.5 in | Wt 166.0 lb

## 2015-03-05 DIAGNOSIS — K4090 Unilateral inguinal hernia, without obstruction or gangrene, not specified as recurrent: Secondary | ICD-10-CM

## 2015-03-05 DIAGNOSIS — Z9181 History of falling: Secondary | ICD-10-CM

## 2015-03-05 DIAGNOSIS — Z0001 Encounter for general adult medical examination with abnormal findings: Secondary | ICD-10-CM | POA: Diagnosis not present

## 2015-03-05 DIAGNOSIS — D492 Neoplasm of unspecified behavior of bone, soft tissue, and skin: Secondary | ICD-10-CM

## 2015-03-05 DIAGNOSIS — Z79899 Other long term (current) drug therapy: Secondary | ICD-10-CM | POA: Diagnosis not present

## 2015-03-05 DIAGNOSIS — E785 Hyperlipidemia, unspecified: Secondary | ICD-10-CM

## 2015-03-05 DIAGNOSIS — Z1331 Encounter for screening for depression: Secondary | ICD-10-CM

## 2015-03-05 DIAGNOSIS — R6889 Other general symptoms and signs: Secondary | ICD-10-CM | POA: Diagnosis not present

## 2015-03-05 DIAGNOSIS — Z0000 Encounter for general adult medical examination without abnormal findings: Secondary | ICD-10-CM

## 2015-03-05 DIAGNOSIS — E559 Vitamin D deficiency, unspecified: Secondary | ICD-10-CM | POA: Diagnosis not present

## 2015-03-05 DIAGNOSIS — R42 Dizziness and giddiness: Secondary | ICD-10-CM

## 2015-03-05 DIAGNOSIS — I10 Essential (primary) hypertension: Secondary | ICD-10-CM | POA: Diagnosis not present

## 2015-03-05 DIAGNOSIS — N400 Enlarged prostate without lower urinary tract symptoms: Secondary | ICD-10-CM

## 2015-03-05 DIAGNOSIS — Z6824 Body mass index (BMI) 24.0-24.9, adult: Secondary | ICD-10-CM

## 2015-03-05 LAB — CBC WITH DIFFERENTIAL/PLATELET
BASOS PCT: 0 % (ref 0–1)
Basophils Absolute: 0 10*3/uL (ref 0.0–0.1)
EOS PCT: 3 % (ref 0–5)
Eosinophils Absolute: 0.1 10*3/uL (ref 0.0–0.7)
HEMATOCRIT: 39.8 % (ref 39.0–52.0)
HEMOGLOBIN: 13.8 g/dL (ref 13.0–17.0)
LYMPHS ABS: 1 10*3/uL (ref 0.7–4.0)
LYMPHS PCT: 20 % (ref 12–46)
MCH: 30.5 pg (ref 26.0–34.0)
MCHC: 34.7 g/dL (ref 30.0–36.0)
MCV: 88.1 fL (ref 78.0–100.0)
MONO ABS: 0.4 10*3/uL (ref 0.1–1.0)
MONOS PCT: 8 % (ref 3–12)
MPV: 9.7 fL (ref 8.6–12.4)
NEUTROS ABS: 3.4 10*3/uL (ref 1.7–7.7)
NEUTROS PCT: 69 % (ref 43–77)
Platelets: 175 10*3/uL (ref 150–400)
RBC: 4.52 MIL/uL (ref 4.22–5.81)
RDW: 14.3 % (ref 11.5–15.5)
WBC: 4.9 10*3/uL (ref 4.0–10.5)

## 2015-03-05 LAB — BASIC METABOLIC PANEL WITH GFR
BUN: 13 mg/dL (ref 6–23)
CO2: 28 mEq/L (ref 19–32)
CREATININE: 0.98 mg/dL (ref 0.50–1.35)
Calcium: 9.1 mg/dL (ref 8.4–10.5)
Chloride: 101 mEq/L (ref 96–112)
GFR, Est African American: >89 mL/min
GFR, Est Non African American: 81 mL/min
Glucose, Bld: 96 mg/dL (ref 70–99)
Potassium: 3.6 mEq/L (ref 3.5–5.3)
Sodium: 139 mEq/L (ref 135–145)

## 2015-03-05 LAB — LIPID PANEL
CHOLESTEROL: 179 mg/dL (ref 0–200)
HDL: 47 mg/dL (ref >=40)
LDL Cholesterol: 114 mg/dL — ABNORMAL HIGH (ref 0–99)
Total CHOL/HDL Ratio: 3.8 Ratio
Triglycerides: 90 mg/dL (ref <150)
VLDL: 18 mg/dL (ref 0–40)

## 2015-03-05 LAB — TSH: TSH: 1.457 u[IU]/mL (ref 0.350–4.500)

## 2015-03-05 LAB — MAGNESIUM: MAGNESIUM: 2.3 mg/dL (ref 1.5–2.5)

## 2015-03-05 LAB — HEPATIC FUNCTION PANEL
ALK PHOS: 61 U/L (ref 39–117)
ALT: 12 U/L (ref 0–53)
AST: 15 U/L (ref 0–37)
Albumin: 4 g/dL (ref 3.5–5.2)
Bilirubin, Direct: 0.2 mg/dL (ref 0.0–0.3)
Indirect Bilirubin: 0.6 mg/dL (ref 0.2–1.2)
TOTAL PROTEIN: 6.4 g/dL (ref 6.0–8.3)
Total Bilirubin: 0.8 mg/dL (ref 0.2–1.2)

## 2015-03-05 MED ORDER — ENALAPRIL MALEATE 20 MG PO TABS: 20.0000 mg | ORAL_TABLET | Freq: Every day | ORAL | Status: DC

## 2015-03-05 NOTE — Progress Notes (Signed)
MEDICARE WELLNESS VISIT AND FOLLOW UP Assessment:   1. Essential hypertension - continue doxazosin, patient stopped enalapril and norvasc due to misunderstanding, start back on enalapril and monitor BP, may need to add back on norvasc 2.5mg  if BP greater than 140/90, DASH diet, exercise and monitor at home. Call if greater than 130/80.  - CBC with Differential/Platelet - BASIC METABOLIC PANEL WITH GFR - Hepatic function panel - TSH - follow up for recheck 2-3 months  2. Hyperlipidemia -continue medications, check lipids, decrease fatty foods, increase activity.  - Lipid panel  3. Vitamin D deficiency - Vit D  25 hydroxy (rtn osteoporosis monitoring)  4. Medication management - Magnesium  5. BPH (benign prostatic hyperplasia) Continue medications  6. Routine general medical examination at a health care facility  7. At low risk for fall  8. Dizziness - US Carotid Duplex Bilateral; Future - increase fluids, check labs.   9. Left inguinal hernia - Ambulatory referral to General Surgery  10. Abnormal skin lesion Refer to estabilished skin doctor, Dr. Ronnald Ramp  Over 30 minutes of exam, counseling, chart review, and critical decision making was performed  Plan:   During the course of the visit the patient was educated and counseled about appropriate screening and preventive services including:    Pneumococcal vaccine   Influenza vaccine  Td vaccine  Screening electrocardiogram  Colorectal cancer screening  Diabetes screening  Glaucoma screening  Nutrition counseling   Conditions/risks identified: BMI: Discussed weight loss, diet, and increase physical activity.  Increase physical activity: AHA recommends 150 minutes of physical activity a week.  Medications reviewed Urinary Incontinence is not an issue: discussed non pharmacology and pharmacology options.  Fall risk: low- discussed PT, home fall assessment, medications.    Subjective:  [name] is  a 64 y.o. male who presents for Medicare Wellness Visit and 3 month follow up for HTN, hyperlipidemia, and vitamin D Def.  Date of last medicare wellness visit is never  His blood pressure has not been controlled at home, he has white coat, he is on doxazosin 8 mg, enalapril 20, and norvasc 5mg  but he stopped taking his ACE and CCB when he started doxazosin due to miss understanding, he was taken off BB due to bradycardia, today their BP is BP: (!) 160/92 mmHg He does workout. He denies chest pain, shortness of breath, dizziness.  He is not on cholesterol medication and denies myalgias. His cholesterol is at goal. The cholesterol last visit was:   Lab Results  Component Value Date   CHOL 176 08/12/2014   HDL 48 08/12/2014   LDLCALC 105* 08/12/2014   TRIG 117 08/12/2014   CHOLHDL 3.7 08/12/2014   He is not prediabetic.  Patient is on Vitamin D supplement.   Lab Results  Component Value Date   VD25OH 35 08/12/2014   He teaches yoga.   He has history of BPH, states doxazosin helps.  Patient non smoker, family with heavy history of smoking and history of carotid problems requesting [place], states when he holds his carotids he gets dizziness.  He has two hernias, one is left inguinal and one is umbilicus. He is very active with walking/running/yoga and he is constantly having to reduce the left inguinal. Requesting evaluation.   Medication Review: Current Outpatient Prescriptions on File Prior to Visit  Medication Sig Dispense Refill  . amLODipine (NORVASC) 5 MG tablet Take 1 tablet (5 mg total) by mouth daily. 90 tablet 1  . B Complex Vitamins (VITAMIN B COMPLEX  PO) Take by mouth.    . doxazosin (CARDURA) 8 MG tablet Take 1 tablet (8 mg total) by mouth daily. 30 tablet 2  . enalapril (VASOTEC) 20 MG tablet Take 1 tablet (20 mg total) by mouth 2 (two) times daily. 180 tablet 1  . vitamin C (ASCORBIC ACID) 500 MG tablet Take 500 mg by mouth daily.    Marland Kitchen VITAMIN E PO Take by mouth daily.     No  current facility-administered medications on file prior to visit.    Current Problems (verified) Patient Active Problem List   Diagnosis Date Noted  . Medication management 08/12/2014  . Hyperlipidemia   . Hypertension   . Vitamin D deficiency     Screening Tests Immunization History  Administered Date(s) Administered  . Influenza-Unspecified 05/17/2014  . Td 11/15/2007   Preventative care: Tetanus: 2009 Pneumovax: declines Prevnar 13: out of in the office.  Flu vaccine: 2015 Zostavax: declines DEXA: N/A Colonoscopy:2008 due 2018, would like done sooner  EGD N/A:  Names of Other Physician/Practitioners you currently use: 1. [facility] here for primary care 2. Dr. Marvel Plan, eye doctor, last visit  3. Dr. Jarrett Soho, dentist, last visit  Patient Care Team: Unk Pinto, MD as PCP - General (Internal Medicine) Ladene Artist, MD as Consulting Physician (Gastroenterology) Agapito Games (Optometry) Jarome Matin, MD as Consulting Physician (Dermatology)  Past Surgical History  Procedure Laterality Date  . Tonsillectomy    . Inguinal hernia repair    . Nasal septum surgery     Family History  Problem Relation Age of Onset  . Stroke Father   . Heart attack Brother   . Cancer Mother     Brain   History  Substance Use Topics  . Smoking status: Former Smoker    Quit date: 08/29/1971  . Smokeless tobacco: Never Used  . Alcohol Use: No    MEDICARE WELLNESS OBJECTIVES: Tobacco use: He does not smoke.  Patient is a former smoker. If yes, counseling given Alcohol Current alcohol use: none Caffeine Current caffeine use: coffee 1 /day Osteoporosis: dietary calcium and/or vitamin D deficiency, History of fracture in the past year: no Diet: in general, a "healthy" diet   Physical activity: walking and exercise class Depression/mood screen:   Depression screen [facility] 2/9 03/05/2015  Decreased Interest 0  Down, Depressed,  Hopeless 0  PHQ - 2 Score 0   Hearing: impaired Visual acuity: normal,  does not perform annual eye exam  ADLs:  In your present state of health, do you have any difficulty performing the following activities: 03/05/2015  Hearing? Y  Vision? N  Difficulty concentrating or making decisions? N  Walking or climbing stairs? N  Dressing or bathing? N  Doing errands, shopping? N  Preparing Food and eating ? N  Using the Toilet? N  In the past six months, have you accidently leaked urine? N  Do you have problems with loss of bowel control? N  Managing your Medications? N  Managing your Finances? N  Housekeeping or managing your Housekeeping? N    Fall risk: Low Risk Cognitive Testing  Alert? Yes  Normal Appearance?Yes  Oriented to person? Yes  Place? Yes   Time? Yes  Recall of three objects?  Yes  Can perform simple calculations? Yes  Displays appropriate judgment?Yes  Can read the correct time from a watch face?Yes  EOL planning: Does patient have an advance directive?: Yes Type of Advance Directive: Living will Does patient want to  make changes to advanced directive?: No - Patient declined Copy of advanced directive(s) in chart?: No - copy requested    Objective:   Blood pressure 160/92, pulse 60, temperature 97.7 F (36.5 C), resp. rate 16, height 5' 9.5" (1.765 m), weight 166 lb (75.297 kg). Body mass index is 24.17 kg/(m^2).  General appearance: alert, no distress, WD/WN, male Eyes: PERRLA, EOMs, conjunctiva no swelling or erythema, normal fundi and vessels.  Sinuses: No Frontal/maxillary tenderness  ENT/Mouth: Ext aud canals clear, normal light reflex with TMs without erythema, bulging. Good dentition. No erythema, swelling, or exudate on post pharynx. Tonsils not swollen or erythematous. Hearing normal.  Neck: Supple, thyroid normal. No bruits.  Respiratory: Respiratory effort normal, BS equal bilaterally without rales, rhonchi, wheezing or stridor.  Cardio: RRR without  murmurs, rubs or gallops. Brisk peripheral pulses without edema.  Chest: symmetric, with normal excursions and percussion.  Abdomen: Soft, nontender, no guarding, rebound, masses, or organomegaly.Left inguinal bulge, retractable.  Lymphatics: Non tender without lymphadenopathy.  Genitourinary: + external hemorrhoid. Slightly boggy, enlarged prostate without nodules/hardness, neg hemoccult in office. Musculoskeletal: Full ROM all peripheral extremities,5/5 strength, and normal gait.  Skin: Warm, dry without rashes, lesions, ecchymosis. Right ear with scaly, erythematous area, area on left upper chest.   Neuro: Cranial nerves intact, reflexes equal bilaterally. Normal muscle tone, no cerebellar symptoms. Sensation intact.  Psych: Awake and oriented X 3, normal affect, Insight and Judgment appropriate.   Medicare Attestation I have personally reviewed: The patient's medical and social history Their use of alcohol, tobacco or illicit drugs Their current medications and supplements The patient's functional ability including ADLs,fall risks, home safety risks, cognitive, and hearing and visual impairment Diet and physical activities Evidence for depression or mood disorders  The patient's weight, height, BMI, and visual acuity have been recorded in the chart.  I have made referrals, counseling, and provided education to the patient based on review of the above and I have provided the patient with a written personalized care plan for preventive services.     Vicie Mutters, PA-C   03/05/2015

## 2015-03-05 NOTE — Patient Instructions (Addendum)
Stay on the doxazosin, this helps your protate AND helps your blood pressure, take at night.  BUT you need to take the enalapril 20mg  and monitor your BP,  If it stays above 140/90 then you can add back 1/2 of the norvasc/amlodipine.   Monitor your blood pressure at home, if it is above 140/90 consistently call the office so we can adjust your medications. Go to the ER if any CP, SOB, nausea, dizziness, severe HA, changes vision/speech  DASH Eating Plan DASH stands for "Dietary Approaches to Stop Hypertension." The DASH eating plan is a healthy eating plan that has been shown to reduce high blood pressure (hypertension). Additional health benefits may include reducing the risk of type 2 diabetes mellitus, heart disease, and stroke. The DASH eating plan may also help with weight loss. WHAT DO I NEED TO KNOW ABOUT THE DASH EATING PLAN? For the DASH eating plan, you will follow these general guidelines:  Choose foods with a percent daily value for sodium of less than 5% (as listed on the food label).  Use salt-free seasonings or herbs instead of table salt or sea salt.  Check with your health care provider or pharmacist before using salt substitutes.  Eat lower-sodium products, often labeled as "lower sodium" or "no salt added."  Eat fresh foods.  Eat more vegetables, fruits, and low-fat dairy products.  Choose whole grains. Look for the word "whole" as the first word in the ingredient list.  Choose fish and skinless chicken or [place] more often than red meat. Limit fish, poultry, and meat to 6 oz (170 g) each day.  Limit sweets, desserts, sugars, and sugary drinks.  Choose heart-healthy fats.  Limit cheese to 1 oz (28 g) per day.  Eat more home-cooked food and less restaurant, buffet, and fast food.  Limit fried foods.  Cook foods using methods other than frying.  Limit canned vegetables. If you do use them, rinse them well to decrease the sodium.  When eating at a restaurant,  ask that your food be prepared with less salt, or no salt if possible. WHAT FOODS CAN I EAT? Seek help from a dietitian for individual calorie needs. Grains Whole grain or whole wheat bread. Brown rice. Whole grain or whole wheat pasta. Quinoa, bulgur, and whole grain cereals. Low-sodium cereals. Corn or whole wheat flour tortillas. Whole grain cornbread. Whole grain crackers. Low-sodium crackers. Vegetables Fresh or frozen vegetables (raw, steamed, roasted, or grilled). Low-sodium or reduced-sodium tomato and vegetable juices. Low-sodium or reduced-sodium tomato sauce and paste. Low-sodium or reduced-sodium canned vegetables.  Fruits All fresh, canned (in natural juice), or frozen fruits. Meat and Other Protein Products Ground beef (85% or leaner), grass-fed beef, or beef trimmed of fat. Skinless chicken or [place]. Ground chicken or [place]. Pork trimmed of fat. All fish and seafood. Eggs. Dried beans, peas, or lentils. Unsalted nuts and seeds. Unsalted canned beans. Dairy Low-fat dairy products, such as skim or 1% milk, 2% or reduced-fat cheeses, low-fat ricotta or cottage cheese, or plain low-fat yogurt. Low-sodium or reduced-sodium cheeses. Fats and Oils Tub margarines without trans fats. Light or reduced-fat mayonnaise and salad dressings (reduced sodium). Avocado. Safflower, olive, or canola oils. Natural peanut or almond butter. Other Unsalted popcorn and pretzels. The items listed above may not be a complete list of recommended foods or beverages. Contact your dietitian for more options. WHAT FOODS ARE NOT RECOMMENDED? Grains White bread. White pasta. White rice. Refined cornbread. Bagels and croissants. Crackers that contain trans fat. Vegetables Creamed  or fried vegetables. Vegetables in a cheese sauce. Regular canned vegetables. Regular canned tomato sauce and paste. Regular tomato and vegetable juices. Fruits Dried fruits. Canned fruit in light or heavy syrup. Fruit juice. Meat  and Other Protein Products Fatty cuts of meat. Ribs, chicken wings, bacon, sausage, bologna, salami, chitterlings, fatback, hot dogs, bratwurst, and packaged luncheon meats. Salted nuts and seeds. Canned beans with salt. Dairy Whole or 2% milk, cream, half-and-half, and cream cheese. Whole-fat or sweetened yogurt. Full-fat cheeses or blue cheese. Nondairy creamers and whipped toppings. Processed cheese, cheese spreads, or cheese curds. Condiments Onion and garlic salt, seasoned salt, table salt, and sea salt. Canned and packaged gravies. Worcestershire sauce. Tartar sauce. Barbecue sauce. Teriyaki sauce. Soy sauce, including reduced sodium. Steak sauce. Fish sauce. Oyster sauce. Cocktail sauce. Horseradish. Ketchup and mustard. Meat flavorings and tenderizers. Bouillon cubes. Hot sauce. Tabasco sauce. Marinades. Taco seasonings. Relishes. Fats and Oils Butter, stick margarine, lard, shortening, ghee, and bacon fat. Coconut, palm kernel, or palm oils. Regular salad dressings. Other Pickles and olives. Salted popcorn and pretzels. The items listed above may not be a complete list of foods and beverages to avoid. Contact your dietitian for more information. WHERE CAN I FIND MORE INFORMATION? National [employer], [employer], [employer]: travelstabloid.com Document Released: 08/03/2011 Document Revised: 12/29/2013 Document Reviewed: 06/18/2013 [facility] Patient Information 2015 [place], [place]. This information is not intended to replace advice given to you by your health care provider. Make sure you discuss any questions you have with your health care provider.  Hernia A hernia occurs when an internal organ pushes out through a weak spot in the abdominal wall. Hernias most commonly occur in the groin and around the navel. Hernias often can be pushed back into place (reduced). Most hernias tend to get worse over time. Some abdominal hernias can get stuck in the opening  (irreducible or incarcerated hernia) and cannot be reduced. An irreducible abdominal hernia which is tightly squeezed into the opening is at risk for impaired blood supply (strangulated hernia). A strangulated hernia is a medical emergency. Because of the risk for an irreducible or strangulated hernia, surgery may be recommended to repair a hernia. CAUSES   Heavy lifting.  Prolonged coughing.  Straining to have a bowel movement.  A cut (incision) made during an abdominal surgery. HOME CARE INSTRUCTIONS   Bed rest is not required. You may continue your normal activities.  Avoid lifting more than 10 pounds (4.5 kg) or straining.  Cough gently. If you are a smoker it is best to stop. Even the best hernia repair can break down with the continual strain of coughing. Even if you do not have your hernia repaired, a cough will continue to aggravate the problem.  Do not wear anything tight over your hernia. Do not try to keep it in with an outside bandage or truss. These can damage abdominal contents if they are trapped within the hernia sac.  Eat a normal diet.  Avoid constipation. Straining over long periods of time will increase hernia size and encourage breakdown of repairs. If you cannot do this with diet alone, stool softeners may be used. SEEK IMMEDIATE MEDICAL CARE IF:   You have a fever.  You develop increasing abdominal pain.  You feel nauseous or vomit.  Your hernia is stuck outside the abdomen, looks discolored, feels hard, or is tender.  You have any changes in your bowel habits or in the hernia that are unusual for you.  You have increased pain or  swelling around the hernia.  You cannot push the hernia back in place by applying gentle pressure while lying down. MAKE SURE YOU:   Understand these instructions.  Will watch your condition.  Will get help right away if you are not doing well or get worse. Document Released: 08/14/2005 Document Revised: 11/06/2011 Document  Reviewed: 04/02/2008 [facility] Patient Information 2015 [place], [place]. This information is not intended to replace advice given to you by your health care provider. Make sure you discuss any questions you have with your health care provider.  Preventive Care for Adults A healthy lifestyle and preventive care can promote health and wellness. Preventive health guidelines for men include the following key practices:  A routine yearly physical is a good way to check with your health care provider about your health and preventative screening. It is a chance to share any concerns and updates on your health and to receive a thorough exam.  Visit your dentist for a routine exam and preventative care every 6 months. Brush your teeth twice a day and floss once a day. Good oral hygiene prevents tooth decay and gum disease.  The frequency of eye exams is based on your age, health, family medical history, use of contact lenses, and other factors. Follow your health care provider's recommendations for frequency of eye exams.  Eat a healthy diet. Foods such as vegetables, fruits, whole grains, low-fat dairy products, and lean protein foods contain the nutrients you need without too many calories. Decrease your intake of foods high in solid fats, added sugars, and salt. Eat the right amount of calories for you.Get information about a proper diet from your health care provider, if necessary.  Regular physical exercise is one of the most important things you can do for your health. Most adults should get at least 150 minutes of moderate-intensity exercise (any activity that increases your heart rate and causes you to sweat) each week. In addition, most adults need muscle-strengthening exercises on 2 or more days a week.  Maintain a healthy weight. The body mass index (BMI) is a screening tool to identify possible weight problems. It provides an estimate of body fat based on height and weight. Your health care  provider can find your BMI and can help you achieve or maintain a healthy weight.For adults 20 years and older:  A BMI below 18.5 is considered underweight.  A BMI of 18.5 to 24.9 is normal.  A BMI of 25 to 29.9 is considered overweight.  A BMI of 30 and above is considered obese.  Maintain normal blood lipids and cholesterol levels by exercising and minimizing your intake of saturated fat. Eat a balanced diet with plenty of fruit and vegetables. Blood tests for lipids and cholesterol should begin at age 63 and be repeated every 5 years. If your lipid or cholesterol levels are high, you are over 50, or you are at high risk for heart disease, you may need your cholesterol levels checked more frequently.Ongoing high lipid and cholesterol levels should be treated with medicines if diet and exercise are not working.  If you smoke, find out from your health care provider how to quit. If you do not use tobacco, do not start.  Lung cancer screening is recommended for adults aged 74-80 years who are at high risk for developing lung cancer because of a history of smoking. A yearly low-dose CT scan of the lungs is recommended for people who have at least a 30-pack-year history of smoking and are a  current smoker or have quit within the past 15 years. A pack year of smoking is smoking an average of 1 pack of cigarettes a day for 1 year (for example: 1 pack a day for 30 years or 2 packs a day for 15 years). Yearly screening should continue until the smoker has stopped smoking for at least 15 years. Yearly screening should be stopped for people who develop a health problem that would prevent them from having lung cancer treatment.  If you choose to drink alcohol, do not have more than 2 drinks per day. One drink is considered to be 12 ounces (355 mL) of beer, 5 ounces (148 mL) of wine, or 1.5 ounces (44 mL) of liquor.  Avoid use of street drugs. Do not share needles with anyone. Ask for help if you need  support or instructions about stopping the use of drugs.  High blood pressure causes heart disease and increases the risk of stroke. Your blood pressure should be checked at least every 1-2 years. Ongoing high blood pressure should be treated with medicines, if weight loss and exercise are not effective.  If you are 5-23 years old, ask your health care provider if you should take aspirin to prevent heart disease.  Diabetes screening involves taking a blood sample to check your fasting blood sugar level. Testing should be considered at a younger age or be carried out more frequently if you are overweight and have at least 1 risk factor for diabetes.  Colorectal cancer can be detected and often prevented. Most routine colorectal cancer screening begins at the age of 73 and continues through age 57. However, your health care provider may recommend screening at an earlier age if you have risk factors for colon cancer. On a yearly basis, your health care provider may provide home test kits to check for hidden blood in the stool. Use of a small camera at the end of a tube to directly examine the colon (sigmoidoscopy or colonoscopy) can detect the earliest forms of colorectal cancer. Talk to your health care provider about this at age 31, when routine screening begins. Direct exam of the colon should be repeated every 5-10 years through age 17, unless early forms of precancerous polyps or small growths are found.  Hepatitis C blood testing is recommended for all people born from [birth year] through 1967 and any individual with known risks for hepatitis C.  New guidelines recommend a once time screening for HIV.   Screening for abdominal aortic aneurysm (AAA)  by ultrasound is recommended for people who have history of high blood pressure or who are current or former smokers.  Healthy men should  receive prostate-specific antigen (PSA) blood tests as part of routine cancer screening. Talk with your health care  provider about prostate cancer screening.  Testicular cancer screening is  recommended for adult males. Screening includes self-exam, a health care provider exam, and other screening tests. Consult with your health care provider about any symptoms you have or any concerns you have about testicular cancer.  Use sunscreen. Apply sunscreen liberally and repeatedly throughout the day. You should seek shade when your shadow is shorter than you. Protect yourself by wearing long sleeves, pants, a wide-brimmed hat, and sunglasses year round, whenever you are outdoors.  Once a month, do a whole-body skin exam, using a mirror to look at the skin on your back. Tell your health care provider about new moles, moles that have irregular borders, moles that are larger than a pencil  eraser, or moles that have changed in shape or color.  Stay current with required vaccines (immunizations).  Influenza vaccine. All adults should be immunized every year.  Tetanus, diphtheria, and acellular pertussis (Td, Tdap) vaccine. An adult who has not previously received Tdap or who does not know his vaccine status should receive 1 dose of Tdap. This initial dose should be followed by tetanus and diphtheria toxoids (Td) booster doses every 10 years. Adults with an unknown or incomplete history of completing a 3-dose immunization series with Td-containing vaccines should begin or complete a primary immunization series including a Tdap dose. Adults should receive a Td booster every 10 years.  Zoster vaccine. One dose is recommended for adults aged 71 years or older unless certain conditions are present.    PREVNAR - Pneumococcal 13-valent conjugate (PCV13) vaccine. When indicated, a person who is uncertain of his immunization history and has no record of immunization should receive the PCV13 vaccine. An adult aged 71 years or older who has certain medical conditions and has not been previously immunized should receive 1 dose of  PCV13 vaccine. This PCV13 should be followed with a dose of pneumococcal polysaccharide (PPSV23) vaccine. The PPSV23 vaccine dose should be obtained at least 8 weeks after the dose of PCV13 vaccine. An adult aged 51 years or older who has certain medical conditions and previously received 1 or more doses of PPSV23 vaccine should receive 1 dose of PCV13. The PCV13 vaccine dose should be obtained 1 or more years after the last PPSV23 vaccine dose.    PNEUMOVAX - Pneumococcal polysaccharide (PPSV23) vaccine. When PCV13 is also indicated, PCV13 should be obtained first. All adults aged 99 years and older should be immunized. An adult younger than age 76 years who has certain medical conditions should be immunized. Any person who resides in a nursing home or long-term care facility should be immunized. An adult smoker should be immunized. People with an immunocompromised condition and certain other conditions should receive both PCV13 and PPSV23 vaccines. People with human immunodeficiency virus (HIV) infection should be immunized as soon as possible after diagnosis. Immunization during chemotherapy or radiation therapy should be avoided. Routine use of PPSV23 vaccine is not recommended for American Indians, [place] Natives, or people younger than 65 years unless there are medical conditions that require PPSV23 vaccine. When indicated, people who have unknown immunization and have no record of immunization should receive PPSV23 vaccine. One-time revaccination 5 years after the first dose of PPSV23 is recommended for people aged 19-64 years who have chronic kidney failure, nephrotic syndrome, asplenia, or immunocompromised conditions. People who received 1-2 doses of PPSV23 before age 62 years should receive another dose of PPSV23 vaccine at age 72 years or later if at least 5 years have passed since the previous dose. Doses of PPSV23 are not needed for people immunized with PPSV23 at or after age 69  years.    Hepatitis A vaccine. Adults who wish to be protected from this disease, have certain high-risk conditions, work with hepatitis A-infected animals, work in hepatitis A research labs, or travel to or work in countries with a high rate of hepatitis A should be immunized. Adults who were previously unvaccinated and who anticipate close contact with an international adoptee during the first 60 days after arrival in the [place] from a country with a high rate of hepatitis A should be immunized.    Hepatitis B vaccine. Adults should be immunized if they wish to be protected from this disease,  have certain high-risk conditions, may be exposed to blood or other infectious body fluids, are household contacts or sex partners of hepatitis B positive people, are clients or workers in certain care facilities, or travel to or work in countries with a high rate of hepatitis B.   Preventive Service / Frequency   Ages 86 and over  Blood pressure check.  Lipid and cholesterol check.  Lung cancer screening. / Every year if you are aged 92-80 years and have a 30-pack-year history of smoking and currently smoke or have quit within the past 15 years. Yearly screening is stopped once you have quit smoking for at least 15 years or develop a health problem that would prevent you from having lung cancer treatment.  Fecal occult blood test (FOBT) of stool. You may not have to do this test if you get a colonoscopy every 10 years.  Flexible sigmoidoscopy** or colonoscopy.** / Every 5 years for a flexible sigmoidoscopy or every 10 years for a colonoscopy beginning at age 51 and continuing until age 10.  Hepatitis C blood test.** / For all people born from [birth year] through 1967 and any individual with known risks for hepatitis C.  Abdominal aortic aneurysm (AAA) screening./ Screening current or former smokers or have Hypertension.  Skin self-exam. / Monthly.  Influenza vaccine. / Every year.  Tetanus,  diphtheria, and acellular pertussis (Tdap/Td) vaccine.** / 1 dose of Td every 10 years.   Zoster vaccine.** / 1 dose for adults aged 35 years or older.         Pneumococcal 13-valent conjugate (PCV13) vaccine.    Pneumococcal polysaccharide (PPSV23) vaccine.     Hepatitis A vaccine.** / Consult your health care provider.  Hepatitis B vaccine.** / Consult your health care provider. Screening for abdominal aortic aneurysm (AAA)  by ultrasound is recommended for people who have history of high blood pressure or who are current or former smokers.

## 2015-03-06 LAB — VITAMIN D 25 HYDROXY (VIT D DEFICIENCY, FRACTURES): VIT D 25 HYDROXY: 36 ng/mL (ref 30–100)

## 2015-03-08 ENCOUNTER — Other Ambulatory Visit: Payer: Self-pay

## 2015-03-08 ENCOUNTER — Encounter: Payer: Self-pay | Admitting: Internal Medicine

## 2015-03-08 MED ORDER — DOXAZOSIN MESYLATE 8 MG PO TABS: 8.0000 mg | ORAL_TABLET | Freq: Every day | ORAL | Status: DC

## 2015-03-08 MED ORDER — ENALAPRIL MALEATE 20 MG PO TABS: 20.0000 mg | ORAL_TABLET | Freq: Every day | ORAL | Status: DC

## 2015-03-24 DIAGNOSIS — K4020 Bilateral inguinal hernia, without obstruction or gangrene, not specified as recurrent: Secondary | ICD-10-CM | POA: Diagnosis not present

## 2015-03-25 ENCOUNTER — Telehealth: Payer: Self-pay | Admitting: Physician Assistant

## 2015-03-25 ENCOUNTER — Other Ambulatory Visit: Payer: Self-pay | Admitting: Physician Assistant

## 2015-03-25 MED ORDER — CARVEDILOL 12.5 MG PO TABS: 12.5000 mg | ORAL_TABLET | Freq: Two times a day (BID) | ORAL | Status: DC

## 2015-03-25 NOTE — Telephone Encounter (Signed)
Will decrease doxazosin to 1/2 daily and put back on 1/2 of the coreg, 12.5mg  BID for BP. States has not been controlled at home.  In addition, requesting PSA however after explaining had done in Dec 2015 and it was normal, and insurance will not pay for another he is willing to wait until next CPE.  Going to have surgery on hernias

## 2015-04-07 ENCOUNTER — Other Ambulatory Visit: Payer: Self-pay

## 2015-04-07 MED ORDER — AMLODIPINE BESYLATE 5 MG PO TABS: 5.0000 mg | ORAL_TABLET | Freq: Every day | ORAL | Status: DC

## 2015-04-27 DIAGNOSIS — K4091 Unilateral inguinal hernia, without obstruction or gangrene, recurrent: Secondary | ICD-10-CM | POA: Diagnosis not present

## 2015-04-27 DIAGNOSIS — K4090 Unilateral inguinal hernia, without obstruction or gangrene, not specified as recurrent: Secondary | ICD-10-CM | POA: Diagnosis not present

## 2015-05-21 DIAGNOSIS — L820 Inflamed seborrheic keratosis: Secondary | ICD-10-CM | POA: Diagnosis not present

## 2015-05-21 DIAGNOSIS — L821 Other seborrheic keratosis: Secondary | ICD-10-CM | POA: Diagnosis not present

## 2015-06-25 ENCOUNTER — Encounter: Payer: Self-pay | Admitting: Physician Assistant

## 2015-08-19 ENCOUNTER — Ambulatory Visit (INDEPENDENT_AMBULATORY_CARE_PROVIDER_SITE_OTHER): Payer: Commercial Managed Care - HMO | Admitting: Physician Assistant

## 2015-08-19 ENCOUNTER — Ambulatory Visit (HOSPITAL_COMMUNITY)
Admission: RE | Admit: 2015-08-19 | Discharge: 2015-08-19 | Disposition: A | Discharge status: Home / Self Care | Payer: Commercial Managed Care - HMO | Source: Ambulatory Visit | Attending: Physician Assistant | Admitting: Physician Assistant

## 2015-08-19 ENCOUNTER — Encounter: Payer: Self-pay | Admitting: Physician Assistant

## 2015-08-19 VITALS — BP 122/88 | HR 60 | Temp 97.7°F | Resp 16 | Ht 70.5 in | Wt 168.0 lb

## 2015-08-19 DIAGNOSIS — Z79899 Other long term (current) drug therapy: Secondary | ICD-10-CM

## 2015-08-19 DIAGNOSIS — I10 Essential (primary) hypertension: Secondary | ICD-10-CM | POA: Diagnosis not present

## 2015-08-19 DIAGNOSIS — E559 Vitamin D deficiency, unspecified: Secondary | ICD-10-CM

## 2015-08-19 DIAGNOSIS — N400 Enlarged prostate without lower urinary tract symptoms: Secondary | ICD-10-CM

## 2015-08-19 DIAGNOSIS — E785 Hyperlipidemia, unspecified: Secondary | ICD-10-CM | POA: Diagnosis not present

## 2015-08-19 DIAGNOSIS — Z0000 Encounter for general adult medical examination without abnormal findings: Secondary | ICD-10-CM | POA: Diagnosis not present

## 2015-08-19 DIAGNOSIS — Z1159 Encounter for screening for other viral diseases: Secondary | ICD-10-CM | POA: Diagnosis not present

## 2015-08-19 DIAGNOSIS — Z125 Encounter for screening for malignant neoplasm of prostate: Secondary | ICD-10-CM

## 2015-08-19 LAB — CBC WITH DIFFERENTIAL/PLATELET
Basophils Absolute: 0 10*3/uL (ref 0.0–0.1)
Basophils Relative: 0 % (ref 0–1)
EOS PCT: 4 % (ref 0–5)
Eosinophils Absolute: 0.2 10*3/uL (ref 0.0–0.7)
HCT: 41.1 % (ref 39.0–52.0)
Hemoglobin: 13.9 g/dL (ref 13.0–17.0)
LYMPHS ABS: 0.8 10*3/uL (ref 0.7–4.0)
Lymphocytes Relative: 18 % (ref 12–46)
MCH: 30.3 pg (ref 26.0–34.0)
MCHC: 33.8 g/dL (ref 30.0–36.0)
MCV: 89.7 fL (ref 78.0–100.0)
MONOS PCT: 8 % (ref 3–12)
MPV: 9.8 fL (ref 8.6–12.4)
Monocytes Absolute: 0.4 10*3/uL (ref 0.1–1.0)
Neutro Abs: 3.1 10*3/uL (ref 1.7–7.7)
Neutrophils Relative %: 70 % (ref 43–77)
PLATELETS: 181 10*3/uL (ref 150–400)
RBC: 4.58 MIL/uL (ref 4.22–5.81)
RDW: 14.2 % (ref 11.5–15.5)
WBC: 4.4 10*3/uL (ref 4.0–10.5)

## 2015-08-19 LAB — HEPATIC FUNCTION PANEL
ALT: 14 U/L (ref 9–46)
AST: 16 U/L (ref 10–35)
Albumin: 4.2 g/dL (ref 3.6–5.1)
Alkaline Phosphatase: 66 U/L (ref 40–115)
BILIRUBIN DIRECT: 0.2 mg/dL (ref <=0.2)
Indirect Bilirubin: 0.6 mg/dL (ref 0.2–1.2)
Total Bilirubin: 0.8 mg/dL (ref 0.2–1.2)
Total Protein: 6.7 g/dL (ref 6.1–8.1)

## 2015-08-19 LAB — MAGNESIUM: Magnesium: 2.3 mg/dL (ref 1.5–2.5)

## 2015-08-19 LAB — BASIC METABOLIC PANEL WITH GFR
BUN: 10 mg/dL (ref 7–25)
CALCIUM: 9.2 mg/dL (ref 8.6–10.3)
CO2: 28 mmol/L (ref 20–31)
CREATININE: 0.88 mg/dL (ref 0.70–1.25)
Chloride: 101 mmol/L (ref 98–110)
GFR, Est Non African American: >89 mL/min (ref >=60)
Glucose, Bld: 89 mg/dL (ref 65–99)
Potassium: 3.6 mmol/L (ref 3.5–5.3)
SODIUM: 139 mmol/L (ref 135–146)

## 2015-08-19 LAB — LIPID PANEL
CHOL/HDL RATIO: 3.1 ratio (ref <=5.0)
CHOLESTEROL: 174 mg/dL (ref 125–200)
HDL: 56 mg/dL (ref >=40)
LDL Cholesterol: 99 mg/dL (ref <130)
TRIGLYCERIDES: 97 mg/dL (ref <150)
VLDL: 19 mg/dL (ref <30)

## 2015-08-19 NOTE — Patient Instructions (Signed)
Preventive Care for Adults A healthy lifestyle and preventive care can promote health and wellness. Preventive health guidelines for men include the following key practices:  A routine yearly physical is a good way to check with your health care provider about your health and preventative screening. It is a chance to share any concerns and updates on your health and to receive a thorough exam.  Visit your dentist for a routine exam and preventative care every 6 months. Brush your teeth twice a day and floss once a day. Good oral hygiene prevents tooth decay and gum disease.  The frequency of eye exams is based on your age, health, family medical history, use of contact lenses, and other factors. Follow your health care provider's recommendations for frequency of eye exams.  Eat a healthy diet. Foods such as vegetables, fruits, whole grains, low-fat dairy products, and lean protein foods contain the nutrients you need without too many calories. Decrease your intake of foods high in solid fats, added sugars, and salt. Eat the right amount of calories for you.Get information about a proper diet from your health care provider, if necessary.  Regular physical exercise is one of the most important things you can do for your health. Most adults should get at least 150 minutes of moderate-intensity exercise (any activity that increases your heart rate and causes you to sweat) each week. In addition, most adults need muscle-strengthening exercises on 2 or more days a week.  Maintain a healthy weight. The body mass index (BMI) is a screening tool to identify possible weight problems. It provides an estimate of body fat based on height and weight. Your health care provider can find your BMI and can help you achieve or maintain a healthy weight.For adults 20 years and older:  A BMI below 18.5 is considered underweight.  A BMI of 18.5 to 24.9 is normal.  A BMI of 25 to 29.9 is considered overweight.  A BMI  of 30 and above is considered obese.  Maintain normal blood lipids and cholesterol levels by exercising and minimizing your intake of saturated fat. Eat a balanced diet with plenty of fruit and vegetables. Blood tests for lipids and cholesterol should begin at age 20 and be repeated every 5 years. If your lipid or cholesterol levels are high, you are over 50, or you are at high risk for heart disease, you may need your cholesterol levels checked more frequently.Ongoing high lipid and cholesterol levels should be treated with medicines if diet and exercise are not working.  If you smoke, find out from your health care provider how to quit. If you do not use tobacco, do not start.  Lung cancer screening is recommended for adults aged 55-80 years who are at high risk for developing lung cancer because of a history of smoking. A yearly low-dose CT scan of the lungs is recommended for people who have at least a 30-pack-year history of smoking and are a current smoker or have quit within the past 15 years. A pack year of smoking is smoking an average of 1 pack of cigarettes a day for 1 year (for example: 1 pack a day for 30 years or 2 packs a day for 15 years). Yearly screening should continue until the smoker has stopped smoking for at least 15 years. Yearly screening should be stopped for people who develop a health problem that would prevent them from having lung cancer treatment.  If you choose to drink alcohol, do not have more than   2 drinks per day. One drink is considered to be 12 ounces (355 mL) of beer, 5 ounces (148 mL) of wine, or 1.5 ounces (44 mL) of liquor.  Avoid use of street drugs. Do not share needles with anyone. Ask for help if you need support or instructions about stopping the use of drugs.  High blood pressure causes heart disease and increases the risk of stroke. Your blood pressure should be checked at least every 1-2 years. Ongoing high blood pressure should be treated with  medicines, if weight loss and exercise are not effective.  If you are 45-79 years old, ask your health care provider if you should take aspirin to prevent heart disease.  Diabetes screening involves taking a blood sample to check your fasting blood sugar level. Testing should be considered at a younger age or be carried out more frequently if you are overweight and have at least 1 risk factor for diabetes.  Colorectal cancer can be detected and often prevented. Most routine colorectal cancer screening begins at the age of 50 and continues through age 75. However, your health care provider may recommend screening at an earlier age if you have risk factors for colon cancer. On a yearly basis, your health care provider may provide home test kits to check for hidden blood in the stool. Use of a small camera at the end of a tube to directly examine the colon (sigmoidoscopy or colonoscopy) can detect the earliest forms of colorectal cancer. Talk to your health care provider about this at age 50, when routine screening begins. Direct exam of the colon should be repeated every 5-10 years through age 75, unless early forms of precancerous polyps or small growths are found.  Hepatitis C blood testing is recommended for all people born from 1945 through 1965 and any individual with known risks for hepatitis C.  New guidelines recommend a once time screening for HIV.   Screening for abdominal aortic aneurysm (AAA)  by ultrasound is recommended for people who have history of high blood pressure or who are current or former smokers.  Healthy men should  receive prostate-specific antigen (PSA) blood tests as part of routine cancer screening. Talk with your health care provider about prostate cancer screening.  Testicular cancer screening is  recommended for adult males. Screening includes self-exam, a health care provider exam, and other screening tests. Consult with your health care provider about any symptoms you  have or any concerns you have about testicular cancer.  Use sunscreen. Apply sunscreen liberally and repeatedly throughout the day. You should seek shade when your shadow is shorter than you. Protect yourself by wearing long sleeves, pants, a wide-brimmed hat, and sunglasses year round, whenever you are outdoors.  Once a month, do a whole-body skin exam, using a mirror to look at the skin on your back. Tell your health care provider about new moles, moles that have irregular borders, moles that are larger than a pencil eraser, or moles that have changed in shape or color.  Stay current with required vaccines (immunizations).  Influenza vaccine. All adults should be immunized every year.  Tetanus, diphtheria, and acellular pertussis (Td, Tdap) vaccine. An adult who has not previously received Tdap or who does not know his vaccine status should receive 1 dose of Tdap. This initial dose should be followed by tetanus and diphtheria toxoids (Td) booster doses every 10 years. Adults with an unknown or incomplete history of completing a 3-dose immunization series with Td-containing vaccines should begin or   complete a primary immunization series including a Tdap dose. Adults should receive a Td booster every 10 years.  Zoster vaccine. One dose is recommended for adults aged 60 years or older unless certain conditions are present.    PREVNAR - Pneumococcal 13-valent conjugate (PCV13) vaccine. When indicated, a person who is uncertain of his immunization history and has no record of immunization should receive the PCV13 vaccine. An adult aged 19 years or older who has certain medical conditions and has not been previously immunized should receive 1 dose of PCV13 vaccine. This PCV13 should be followed with a dose of pneumococcal polysaccharide (PPSV23) vaccine. The PPSV23 vaccine dose should be obtained at least 8 weeks after the dose of PCV13 vaccine. An adult aged 19 years or older who has certain medical  conditions and previously received 1 or more doses of PPSV23 vaccine should receive 1 dose of PCV13. The PCV13 vaccine dose should be obtained 1 or more years after the last PPSV23 vaccine dose.    PNEUMOVAX - Pneumococcal polysaccharide (PPSV23) vaccine. When PCV13 is also indicated, PCV13 should be obtained first. All adults aged 65 years and older should be immunized. An adult younger than age 65 years who has certain medical conditions should be immunized. Any person who resides in a nursing home or long-term care facility should be immunized. An adult smoker should be immunized. People with an immunocompromised condition and certain other conditions should receive both PCV13 and PPSV23 vaccines. People with human immunodeficiency virus (HIV) infection should be immunized as soon as possible after diagnosis. Immunization during chemotherapy or radiation therapy should be avoided. Routine use of PPSV23 vaccine is not recommended for American Indians, [place] Natives, or people younger than 65 years unless there are medical conditions that require PPSV23 vaccine. When indicated, people who have unknown immunization and have no record of immunization should receive PPSV23 vaccine. One-time revaccination 5 years after the first dose of PPSV23 is recommended for people aged 19-64 years who have chronic kidney failure, nephrotic syndrome, asplenia, or immunocompromised conditions. People who received 1-2 doses of PPSV23 before age 65 years should receive another dose of PPSV23 vaccine at age 65 years or later if at least 5 years have passed since the previous dose. Doses of PPSV23 are not needed for people immunized with PPSV23 at or after age 65 years.    Hepatitis A vaccine. Adults who wish to be protected from this disease, have certain high-risk conditions, work with hepatitis A-infected animals, work in hepatitis A research labs, or travel to or work in countries with a high rate of hepatitis A should be  immunized. Adults who were previously unvaccinated and who anticipate close contact with an international adoptee during the first 60 days after arrival in the [place] from a country with a high rate of hepatitis A should be immunized.    Hepatitis B vaccine. Adults should be immunized if they wish to be protected from this disease, have certain high-risk conditions, may be exposed to blood or other infectious body fluids, are household contacts or sex partners of hepatitis B positive people, are clients or workers in certain care facilities, or travel to or work in countries with a high rate of hepatitis B.   Preventive Service / Frequency   Ages 65 and over  Blood pressure check.  Lipid and cholesterol check.  Lung cancer screening. / Every year if you are aged 55-80 years and have a 30-pack-year history of smoking and currently smoke or have quit   within the past 15 years. Yearly screening is stopped once you have quit smoking for at least 15 years or develop a health problem that would prevent you from having lung cancer treatment.  Fecal occult blood test (FOBT) of stool. You may not have to do this test if you get a colonoscopy every 10 years.  Flexible sigmoidoscopy** or colonoscopy.** / Every 5 years for a flexible sigmoidoscopy or every 10 years for a colonoscopy beginning at age 50 and continuing until age 75.  Hepatitis C blood test.** / For all people born from 1945 through 1965 and any individual with known risks for hepatitis C.  Abdominal aortic aneurysm (AAA) screening./ Screening current or former smokers or have Hypertension.  Skin self-exam. / Monthly.  Influenza vaccine. / Every year.  Tetanus, diphtheria, and acellular pertussis (Tdap/Td) vaccine.** / 1 dose of Td every 10 years.   Zoster vaccine.** / 1 dose for adults aged 60 years or older.         Pneumococcal 13-valent conjugate (PCV13) vaccine.    Pneumococcal polysaccharide (PPSV23) vaccine.      Hepatitis A vaccine.** / Consult your health care provider.  Hepatitis B vaccine.** / Consult your health care provider. Screening for abdominal aortic aneurysm (AAA)  by ultrasound is recommended for people who have history of high blood pressure or who are current or former smokers.   

## 2015-08-19 NOTE — Progress Notes (Signed)
Complete Physical  Assessment and Plan: 1. Essential hypertension Stop the coreg due to symptomatic brady, start doxazosin for BPH, monitor BP - continue medications, DASH diet, exercise and monitor at home. Call if greater than 130/80.  - CBC with Differential - BASIC METABOLIC PANEL WITH GFR - Hepatic function panel - TSH - Urinalysis, Routine w reflex microscopic - Microalbumin / creatinine urine ratio - EKG 12-Lead - [place], RETROPERITNL ABD,  LTD  2. Hyperlipidemia -continue medications, check lipids, decrease fatty foods, increase activity.  - Lipid panel  3. Vitamin D deficiency - Vit D  25 hydroxy (rtn osteoporosis monitoring)  4. Medication management - Magnesium  5. Prostate cancer screening Hemoccult negative in office - PSA  6. BPH (benign prostatic hyperplasia) continue doxazosin per orders, check PSA, normal prostate exam  7. Abnormality, skin Follow up with Dr. Ronnald Ramp   Discussed med's effects and SE's. Screening labs and tests as requested with regular follow-up as recommended.  HPI Patient presents for a complete physical.    His blood pressure has been controlled at home, checks 2-3 times a week, states he is nervous when he comes in, he is on doxazosin 8 mg 1/2 , norvasc 5 mg, enalapril 20mg  and coreg 25mg  take 1/2 QD, today their BP is BP: 122/88 mmHg He does workout. He denies chest pain, shortness of breath, dizziness.  He is not on cholesterol medication and denies myalgias. His cholesterol is at goal. The cholesterol last visit was:   Lab Results  Component Value Date   CHOL 179 03/05/2015   HDL 47 03/05/2015   LDLCALC 114* 03/05/2015   TRIG 90 03/05/2015   CHOLHDL 3.8 03/05/2015    Last A1C in the office was: 5.0 Patient is on Vitamin D supplement.   Lab Results  Component Value Date   VD25OH 36 03/05/2015   He teaches yoga.  Last PSA was:  Lab Results  Component Value Date   PSA 1.03 08/12/2014   He had hernia repair with Dr. Constance Holster.    Current Medications:  Current Outpatient Prescriptions on File Prior to Visit  Medication Sig Dispense Refill  . amLODipine (NORVASC) 5 MG tablet Take 1 tablet (5 mg total) by mouth daily. 90 tablet 2  . B Complex Vitamins (VITAMIN B COMPLEX PO) Take by mouth.    . carvedilol (COREG) 12.5 MG tablet Take 1 tablet (12.5 mg total) by mouth 2 (two) times daily with a meal. 180 tablet 1  . doxazosin (CARDURA) 8 MG tablet Take 1 tablet (8 mg total) by mouth daily. 90 tablet 3  . enalapril (VASOTEC) 20 MG tablet Take 1 tablet (20 mg total) by mouth daily. 90 tablet 3  . vitamin C (ASCORBIC ACID) 500 MG tablet Take 500 mg by mouth daily.    Marland Kitchen VITAMIN E PO Take by mouth daily.     No current facility-administered medications on file prior to visit.   Health Maintenance:  Immunization History  Administered Date(s) Administered  . Influenza-Unspecified 05/17/2014  . Td 11/15/2007   Tetanus: 2009 Pneumovax: declines Flu vaccine: 2016 Prevnar 13: out in the office Zostavax: declines DEXA: Colonoscopy:2008 due 2018 EGD: Eye: Dr. Marvel Plan Dentist: Dr. Jarrett Soho  Patient Care Team: Unk Pinto, MD as PCP - General (Internal Medicine) Ladene Artist, MD as Consulting Physician (Gastroenterology) Agapito Games (Optometry) Jarome Matin, MD as Consulting Physician (Dermatology)  Allergies:  Allergies  Allergen Reactions  . Penicillins    Medical History:  Past Medical History  Diagnosis Date  .  Diverticulosis   . Internal hemorrhoid   . Hyperlipidemia   . Hypertension   . Vitamin D deficiency    Surgical History:  Past Surgical History  Procedure Laterality Date  . Tonsillectomy    . Inguinal hernia repair    . Nasal septum surgery     Family History:  Family History  Problem Relation Age of Onset  . Stroke Father   . Heart attack Brother   . Cancer Mother     Brain   Social History:   Social History  Substance Use Topics  . Smoking status: Former Smoker     Quit date: 08/29/1971  . Smokeless tobacco: Never Used  . Alcohol Use: No    Review of Systems:  Review of Systems  Constitutional: Positive for malaise/fatigue (worse after taking coreg). Negative for fever, chills, weight loss and diaphoresis.  HENT: Positive for hearing loss. Negative for congestion, ear discharge, ear pain, nosebleeds, sore throat and tinnitus.   Eyes: Negative.   Respiratory: Negative.  Negative for stridor.   Cardiovascular: Negative.   Genitourinary: Positive for urgency and frequency. Negative for dysuria, hematuria and flank pain.  Musculoskeletal: Positive for joint pain (fingers). Negative for myalgias, back pain, falls and neck pain.  Skin: Negative.   Neurological: Negative.  Negative for weakness and headaches.  Psychiatric/Behavioral: Negative.     Physical Exam: Estimated body mass index is 23.76 kg/(m^2) as calculated from the following:   Height as of this encounter: 5' 10.5" (1.791 m).   Weight as of this encounter: 168 lb (76.204 kg). BP 122/88 mmHg  Pulse 60  Temp(Src) 97.7 F (36.5 C) (Temporal)  Resp 16  Ht 5' 10.5" (1.791 m)  Wt 168 lb (76.204 kg)  BMI 23.76 kg/m2  SpO2 99% General Appearance: Well nourished, in no apparent distress.  Eyes: PERRLA, EOMs, conjunctiva no swelling or erythema, normal fundi and vessels.  Sinuses: No Frontal/maxillary tenderness  ENT/Mouth: Ext aud canals clear, normal light reflex with TMs without erythema, bulging. Good dentition. No erythema, swelling, or exudate on post pharynx. Tonsils not swollen or erythematous. Hearing normal.  Neck: Supple, thyroid normal. No bruits.  Respiratory: Respiratory effort normal, BS equal bilaterally without rales, rhonchi, wheezing or stridor.  Cardio: RRR without murmurs, rubs or gallops. Brisk peripheral pulses without edema.  Chest: symmetric, with normal excursions and percussion.  Abdomen: Soft, nontender, no guarding, rebound, hernias, masses, or organomegaly. .   Lymphatics: Non tender without lymphadenopathy.  Genitourinary: + external hemorrhoid. Slightly boggy, without nodules/hardness, neg hemoccult in office. Musculoskeletal: Full ROM all peripheral extremities,5/5 strength, and normal gait.  Skin: Warm, dry without rashes, lesions, ecchymosis. Right ear with scaly, erythematous area.  Neuro: Cranial nerves intact, reflexes equal bilaterally. Normal muscle tone, no cerebellar symptoms. Sensation intact.  Psych: Awake and oriented X 3, normal affect, Insight and Judgment appropriate.   EKG: Sinus brady, WNL no changes. AORTA SCAN: defer  Vicie Mutters 10:25 AM [facility]

## 2015-08-20 LAB — URINALYSIS, ROUTINE W REFLEX MICROSCOPIC
Bilirubin Urine: NEGATIVE
Glucose, UA: NEGATIVE
Hgb urine dipstick: NEGATIVE
Ketones, ur: NEGATIVE
LEUKOCYTES UA: NEGATIVE
Nitrite: NEGATIVE
PROTEIN: NEGATIVE
Specific Gravity, Urine: 1.013 (ref 1.001–1.035)
pH: 7.5 (ref 5.0–8.0)

## 2015-08-20 LAB — MICROALBUMIN / CREATININE URINE RATIO
CREATININE, URINE: 101 mg/dL (ref 20–370)
MICROALB/CREAT RATIO: 8 ug/mg{creat} (ref <30)
Microalb, Ur: 0.8 mg/dL

## 2015-08-20 LAB — VITAMIN D 25 HYDROXY (VIT D DEFICIENCY, FRACTURES): VIT D 25 HYDROXY: 12 ng/mL — AB (ref 30–100)

## 2015-08-20 LAB — HEPATITIS C ANTIBODY: HCV Ab: NEGATIVE

## 2015-08-20 LAB — PSA: PSA: 1.15 ng/mL (ref <=4.00)

## 2015-08-20 LAB — TSH: TSH: 1.26 u[IU]/mL (ref 0.350–4.500)

## 2015-09-14 ENCOUNTER — Other Ambulatory Visit: Payer: Self-pay | Admitting: Physician Assistant

## 2015-10-14 ENCOUNTER — Encounter: Payer: Self-pay | Admitting: Internal Medicine

## 2015-10-14 ENCOUNTER — Ambulatory Visit (INDEPENDENT_AMBULATORY_CARE_PROVIDER_SITE_OTHER): Payer: Commercial Managed Care - HMO | Admitting: Internal Medicine

## 2015-10-14 VITALS — BP 144/82 | HR 72 | Temp 97.5°F | Resp 16 | Ht 69.5 in | Wt 166.2 lb

## 2015-10-14 DIAGNOSIS — I10 Essential (primary) hypertension: Secondary | ICD-10-CM

## 2015-10-14 MED ORDER — DOXAZOSIN MESYLATE 8 MG PO TABS: ORAL_TABLET | ORAL | Status: DC

## 2015-10-14 MED ORDER — ENALAPRIL MALEATE 20 MG PO TABS: ORAL_TABLET | ORAL | Status: DC

## 2015-10-14 MED ORDER — BISOPROLOL-HYDROCHLOROTHIAZIDE 5-6.25 MG PO TABS: ORAL_TABLET | ORAL | Status: DC

## 2015-10-14 NOTE — Progress Notes (Signed)
  Subjective:    Patient ID: [name], male    DOB: 1951/02/14, 65 y.o.   MRN: [id number]  HPI  Patient presents with c/o "feeling" bad on the 4 BP meds he's taking and note that he take as all 4 at bedtime. He has been on several different meds in the past years with intolerances to several medications. 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 Neg for HA, dizziness, CP, palpitations, PND/Orthopnea or edema, says he "justs feels bad".  Medication Sig  . B Complex Vitamins  Take by mouth.  . vitamin C  500 MG tablet Take 500 mg by mouth daily.  Marland Kitchen VITAMIN E PO Take by mouth daily.  Marland Kitchen amLODipine (NORVASC) 5 MG tablet Take 1 tablet daily. - Takes qhs  . carvedilol (COREG) 12.5 MG tablet TAKE 1 TABLET TWICE DAILY WITH A MEAL  . doxazosin (CARDURA) 8 MG tablet Take 1 tablet (8 mg total) by mouth daily. - Takes  1/2 qhs  . enalapril (VASOTEC) 20 MG tablet Take 1 tablet (20 mg total) by mouth daily.- Takes 1/2 qhs   Allergies  Allergen Reactions  . Penicillins    Past Medical History  Diagnosis Date  . Diverticulosis   . Internal hemorrhoid   . Hyperlipidemia   . Hypertension   . Vitamin D deficiency    Review of Systems 10 point systems review negative except as above.    Objective:   Physical Exam  BP 144/82 mmHg  Pulse 72  Temp(Src) 97.5 F (36.4 C)  Resp 16  Ht 5' 9.5" (1.765 m)  Wt 166 lb 3.2 oz (75.388 kg)  BMI 24.20 kg/m2  HEENT - Eac's patent. TM's Nl. EOM's full. PERRLA. NasoOroPharynx clear. Neck - supple. Nl Thyroid. Carotids 2+ & No bruits, nodes, JVD Chest - Clear equal BS w/o Rales, rhonchi, wheezes. Cor - Nl HS. RRR w/o sig MGR. PP 1(+). No edema. MS- FROM w/o deformities. Muscle power, tone and bulk Nl. Gait Nl. Neuro - No obvious Cr N abnormalities. Sensory, motor and Cerebellar functions appear Nl w/o focal abnormalities. Psyche - Mental status- somewhat anxious.      Assessment & Plan:   1. Essential hypertension  - d/c Amlodipine,  Coreg  - Change doxazosin (CARDURA) 8 MG tablet; Take 1/4 tablet = (2 mg)    2 x day -  AM & PM  - Keep same - enalapril (VASOTEC) 20 MG tablet; Take 1/2 tablet at nite for BP   - Add - bisoprolol-hydrochlorothiazide (ZIAC) 5-6.25 MG tablet; Take 1 tablet every am for BP    - recc monitor BP's and ROV in 3 weeks

## 2015-10-14 NOTE — Patient Instructions (Signed)
Stop Amlodipine & Coreg (Carvedilol)   +++++++++++++++++++++++++++++  Change Doxazosin 8 mg to 1/4 tablet 2 x/day - morning & nite +++++++++++++++++++++++++++++  New start Ziac-5  (Bisoprolol) every morning  +++++++++++++++++++++++++++++  Take Vasotec 20 mg x 1/2 tablet at nite  +++++++++++++++++++++++++++++  Recommend Adult Low Dose Aspirin or   coated  Aspirin 81 mg daily   To reduce risk of Colon Cancer 20 %,   Skin Cancer 26 % ,   Melanoma 46%   and   Pancreatic cancer 60%   ++++++++++++++++++++++++++++++++++++++++++++++++++++++ Vitamin D goal   is between 70-100.   Please make sure that you are taking your Vitamin D as directed.   It is very important as a natural anti-inflammatory   helping hair, skin, and nails, as well as reducing stroke and heart attack risk.   It helps your bones and helps with mood.  It also decreases numerous cancer risks so please take it as directed.   Low Vit D is associated with a 200-300% higher risk for CANCER   and 200-300% higher risk for HEART   ATTACK  &  STROKE.   .....................................Marland Kitchen  It is also associated with higher death rate at younger ages,   autoimmune diseases like Rheumatoid arthritis, Lupus, Multiple Sclerosis.     Also many other serious conditions, like depression, Alzheimer's  Dementia, infertility, muscle aches, fatigue, fibromyalgia - just to name a few.  ++++++++++++++++++++++++++++++++++++++++++++++++  Recommend the book "The END of DIETING" by Dr Excell Seltzer   & the book "The END of DIABETES " by Dr Excell Seltzer  At [email or web address] - get book & Audio CD's     Being diabetic has a  300% increased risk for heart attack, stroke, cancer, and alzheimer- type vascular dementia. It is very important that you work harder with diet by avoiding all foods that are white. Avoid white rice (brown & wild rice is OK), white potatoes (sweetpotatoes in moderation is OK), White bread or wheat bread  or anything made out of white flour like bagels, donuts, rolls, buns, biscuits, cakes, pastries, cookies, pizza crust, and pasta (made from white flour & egg whites) - vegetarian pasta or spinach or wheat pasta is OK. Multigrain breads like Arnold's or Pepperidge [employer], or multigrain sandwich thins or flatbreads.  Diet, exercise and weight loss can reverse and cure diabetes in the early stages.  Diet, exercise and weight loss is very important in the control and prevention of complications of diabetes which affects every system in your body, ie. Brain - dementia/stroke, eyes - glaucoma/blindness, heart - heart attack/heart failure, kidneys - dialysis, stomach - gastric paralysis, intestines - malabsorption, nerves - severe painful neuritis, circulation - gangrene & loss of a leg(s), and finally cancer and Alzheimers.    I recommend avoid fried & greasy foods,  sweets/candy, white rice (brown or wild rice or Quinoa is OK), white potatoes (sweet potatoes are OK) - anything made from white flour - bagels, doughnuts, rolls, buns, biscuits,white and wheat breads, pizza crust and traditional pasta made of white flour & egg white(vegetarian pasta or spinach or wheat pasta is OK).  Multi-grain bread is OK - like multi-grain flat bread or sandwich thins. Avoid alcohol in excess. Exercise is also important.    Eat all the vegetables you want - avoid meat, especially red meat and dairy - especially cheese.  Cheese is the most concentrated form of trans-fats which is the worst thing to clog up our arteries. Veggie cheese is OK which can be  found in the fresh produce section at [facility] or [employer] or [employer]  ++++++++++++++++++++++++++++++++++++++++++++++++++ DASH Eating Plan  DASH stands for "Dietary Approaches to Stop Hypertension."   The DASH eating plan is a healthy eating plan that has been shown to reduce high blood pressure (hypertension). Additional health benefits may include reducing the risk of  type 2 diabetes mellitus, heart disease, and stroke. The DASH eating plan may also help with weight loss.  WHAT DO I NEED TO KNOW ABOUT THE DASH EATING PLAN?  For the DASH eating plan, you will follow these general guidelines:  Choose foods with a percent daily value for sodium of less than 5% (as listed on the food label).  Use salt-free seasonings or herbs instead of table salt or sea salt.  Check with your health care provider or pharmacist before using salt substitutes.  Eat lower-sodium products, often labeled as "lower sodium" or "no salt added."  Eat fresh foods.  Eat more vegetables, fruits, and low-fat dairy products.    Choose whole grains. Look for the word "whole" as the first word in the ingredient list.  Choose fish   Limit sweets, desserts, sugars, and sugary drinks.  Choose heart-healthy fats.  Eat veggie cheese   Eat more home-cooked food and less restaurant, buffet, and fast food.  Limit fried foods.  Cook foods using methods other than frying.  Limit canned vegetables. If you do use them, rinse them well to decrease the sodium.  When eating at a restaurant, ask that your food be prepared with less salt, or no salt if possible.                      WHAT FOODS CAN I EAT?  Read Dr Fara Olden Fuhrman's books on The End of Dieting & The End of Diabetes  Grains  Whole grain or whole wheat bread. Brown rice. Whole grain or whole wheat pasta. Quinoa, bulgur, and whole grain cereals. Low-sodium cereals. Corn or whole wheat flour tortillas. Whole grain cornbread. Whole grain crackers. Low-sodium crackers.  Vegetables  Fresh or frozen vegetables (raw, steamed, roasted, or grilled). Low-sodium or reduced-sodium tomato and vegetable juices. Low-sodium or reduced-sodium tomato sauce and paste. Low-sodium or reduced-sodium canned vegetables.   Fruits  All fresh, canned (in natural juice), or frozen fruits.  Protein Products   All fish and seafood.  Dried beans,  peas, or lentils. Unsalted nuts and seeds. Unsalted canned beans.  Dairy  Low-fat dairy products, such as skim or 1% milk, 2% or reduced-fat cheeses, low-fat ricotta or cottage cheese, or plain low-fat yogurt. Low-sodium or reduced-sodium cheeses.  Fats and Oils  Tub margarines without trans fats. Light or reduced-fat mayonnaise and salad dressings (reduced sodium). Avocado. Safflower, olive, or canola oils. Natural peanut or almond butter.  Other  Unsalted popcorn and pretzels. The items listed above may not be a complete list of recommended foods or beverages. Contact your dietitian for more options.  +++++++++++++++++++++++++++++++++++++++++++  WHAT FOODS ARE NOT RECOMMENDED?  Grains/ White flour or wheat flour  White bread. White pasta. White rice. Refined cornbread. Bagels and croissants. Crackers that contain trans fat.  Vegetables  Creamed or fried vegetables. Vegetables in a . Regular canned vegetables. Regular canned tomato sauce and paste. Regular tomato and vegetable juices.  Fruits  Dried fruits. Canned fruit in light or heavy syrup. Fruit juice.  Meat and Other Protein Products  Meat in general - RED mwaet & White meat.  Fatty cuts of meat. Ribs,  chicken wings, bacon, sausage, bologna, salami, chitterlings, fatback, hot dogs, bratwurst, and packaged luncheon meats.  Dairy  Whole or 2% milk, cream, half-and-half, and cream cheese. Whole-fat or sweetened yogurt. Full-fat cheeses or blue cheese. Nondairy creamers and whipped toppings. Processed cheese, cheese spreads, or cheese curds.  Condiments  Onion and garlic salt, seasoned salt, table salt, and sea salt. Canned and packaged gravies. Worcestershire sauce. Tartar sauce. Barbecue sauce. Teriyaki sauce. Soy sauce, including reduced sodium. Steak sauce. Fish sauce. Oyster sauce. Cocktail sauce. Horseradish. Ketchup and mustard. Meat flavorings and tenderizers. Bouillon cubes. Hot sauce. Tabasco sauce. Marinades.  Taco seasonings. Relishes.  Fats and Oils Butter, stick margarine, lard, shortening and bacon fat. Coconut, palm kernel, or palm oils. Regular salad dressings.  Pickles and olives. Salted popcorn and pretzels.  The items listed above may not be a complete list of foods and beverages to avoid.

## 2015-11-08 ENCOUNTER — Other Ambulatory Visit: Payer: Self-pay | Admitting: Internal Medicine

## 2015-11-08 ENCOUNTER — Other Ambulatory Visit: Payer: Self-pay | Admitting: *Deleted

## 2015-11-08 DIAGNOSIS — I10 Essential (primary) hypertension: Secondary | ICD-10-CM

## 2015-11-08 MED ORDER — LORATADINE 10 MG PO TABS: 10.0000 mg | ORAL_TABLET | Freq: Every day | ORAL | Status: DC

## 2015-11-09 ENCOUNTER — Telehealth: Payer: Self-pay | Admitting: *Deleted

## 2015-11-09 NOTE — Telephone Encounter (Signed)
Patient called and states he called [facility] and was told his RX for Claritin 10 mg was not in their system.  After calling the [facility] distribution center, I was told the RX was received on 11/08/2015 and was in process.  I stressed that the patient wanted brand Claritin and the pharmacy tech added an extra note to the pharmacist to express his desire for brand.  Patient was advised of the call.

## 2016-01-17 ENCOUNTER — Other Ambulatory Visit: Payer: Self-pay | Admitting: Internal Medicine

## 2016-01-17 DIAGNOSIS — I10 Essential (primary) hypertension: Secondary | ICD-10-CM

## 2016-03-14 ENCOUNTER — Ambulatory Visit: Payer: Self-pay | Admitting: Physician Assistant

## 2016-03-20 ENCOUNTER — Ambulatory Visit: Payer: Self-pay | Admitting: Physician Assistant

## 2016-03-29 ENCOUNTER — Other Ambulatory Visit: Payer: Self-pay

## 2016-03-29 DIAGNOSIS — I10 Essential (primary) hypertension: Secondary | ICD-10-CM

## 2016-03-29 MED ORDER — AMLODIPINE BESYLATE 5 MG PO TABS: ORAL_TABLET | ORAL | 0 refills | Status: DC

## 2016-04-20 ENCOUNTER — Ambulatory Visit: Payer: Self-pay | Admitting: Physician Assistant

## 2016-05-12 ENCOUNTER — Ambulatory Visit: Payer: Self-pay | Admitting: Physician Assistant

## 2016-05-23 ENCOUNTER — Other Ambulatory Visit: Payer: Self-pay

## 2016-05-23 DIAGNOSIS — I10 Essential (primary) hypertension: Secondary | ICD-10-CM

## 2016-05-23 MED ORDER — ENALAPRIL MALEATE 20 MG PO TABS: ORAL_TABLET | ORAL | 0 refills | Status: DC

## 2016-05-23 MED ORDER — DOXAZOSIN MESYLATE 8 MG PO TABS: ORAL_TABLET | ORAL | 0 refills | Status: DC

## 2016-06-02 ENCOUNTER — Encounter: Payer: Self-pay | Admitting: Physician Assistant

## 2016-06-02 ENCOUNTER — Ambulatory Visit (INDEPENDENT_AMBULATORY_CARE_PROVIDER_SITE_OTHER): Payer: Commercial Managed Care - HMO | Admitting: Physician Assistant

## 2016-06-02 ENCOUNTER — Ambulatory Visit (HOSPITAL_COMMUNITY)
Admission: RE | Admit: 2016-06-02 | Discharge: 2016-06-02 | Disposition: A | Discharge status: Home / Self Care | Payer: Commercial Managed Care - HMO | Source: Ambulatory Visit | Attending: Physician Assistant | Admitting: Physician Assistant

## 2016-06-02 VITALS — BP 140/90 | HR 65 | Temp 97.7°F | Ht 69.5 in | Wt 168.0 lb

## 2016-06-02 DIAGNOSIS — Z79899 Other long term (current) drug therapy: Secondary | ICD-10-CM

## 2016-06-02 DIAGNOSIS — Z0001 Encounter for general adult medical examination with abnormal findings: Secondary | ICD-10-CM

## 2016-06-02 DIAGNOSIS — R6889 Other general symptoms and signs: Secondary | ICD-10-CM

## 2016-06-02 DIAGNOSIS — M47816 Spondylosis without myelopathy or radiculopathy, lumbar region: Secondary | ICD-10-CM | POA: Diagnosis not present

## 2016-06-02 DIAGNOSIS — M545 Low back pain, unspecified: Secondary | ICD-10-CM

## 2016-06-02 DIAGNOSIS — M5136 Other intervertebral disc degeneration, lumbar region: Secondary | ICD-10-CM | POA: Insufficient documentation

## 2016-06-02 DIAGNOSIS — I10 Essential (primary) hypertension: Secondary | ICD-10-CM

## 2016-06-02 DIAGNOSIS — E559 Vitamin D deficiency, unspecified: Secondary | ICD-10-CM | POA: Diagnosis not present

## 2016-06-02 DIAGNOSIS — Z0000 Encounter for general adult medical examination without abnormal findings: Secondary | ICD-10-CM

## 2016-06-02 DIAGNOSIS — N401 Enlarged prostate with lower urinary tract symptoms: Secondary | ICD-10-CM | POA: Diagnosis not present

## 2016-06-02 DIAGNOSIS — E785 Hyperlipidemia, unspecified: Secondary | ICD-10-CM

## 2016-06-02 DIAGNOSIS — I700 Atherosclerosis of aorta: Secondary | ICD-10-CM | POA: Insufficient documentation

## 2016-06-02 MED ORDER — AMLODIPINE BESYLATE 5 MG PO TABS: ORAL_TABLET | ORAL | 0 refills | Status: DC

## 2016-06-02 MED ORDER — ENALAPRIL MALEATE 20 MG PO TABS: ORAL_TABLET | ORAL | 0 refills | Status: DC

## 2016-06-02 MED ORDER — CARVEDILOL 12.5 MG PO TABS: 12.5000 mg | ORAL_TABLET | Freq: Two times a day (BID) | ORAL | 0 refills | Status: DC

## 2016-06-02 MED ORDER — CARVEDILOL 12.5 MG PO TABS: 12.5000 mg | ORAL_TABLET | Freq: Two times a day (BID) | ORAL | 6 refills | Status: DC

## 2016-06-02 NOTE — Patient Instructions (Addendum)
coreg 12.5 1/2 BId enalapril 20mg  1/2 BId norvasc 5mg  start 1/2 daily and can increase to 1 pill if needed  Due for colonosocpy   Lumbosacral Strain Lumbosacral strain is a strain of any of the parts that make up your lumbosacral vertebrae. Your lumbosacral vertebrae are the bones that make up the lower third of your backbone. Your lumbosacral vertebrae are held together by muscles and tough, fibrous tissue (ligaments).  CAUSES  A sudden blow to your back can cause lumbosacral strain. Also, anything that causes an excessive stretch of the muscles in the low back can cause this strain. This is typically seen when people exert themselves strenuously, fall, lift heavy objects, bend, or crouch repeatedly. RISK FACTORS  Physically demanding work.  Participation in pushing or pulling sports or sports that require a sudden twist of the back (tennis, golf, baseball).  Weight lifting.  Excessive lower back curvature.  Forward-tilted pelvis.  Weak back or abdominal muscles or both.  Tight hamstrings. SIGNS AND SYMPTOMS  Lumbosacral strain may cause pain in the area of your injury or pain that moves (radiates) down your leg.  DIAGNOSIS Your health care provider can often diagnose lumbosacral strain through a physical exam. In some cases, you may need tests such as X-ray exams.  TREATMENT  Treatment for your lower back injury depends on many factors that your clinician will have to evaluate. However, most treatment will include the use of anti-inflammatory medicines. HOME CARE INSTRUCTIONS   Avoid hard physical activities (tennis, racquetball, waterskiing) if you are not in proper physical condition for it. This may aggravate or create problems.  If you have a back problem, avoid sports requiring sudden body movements. Swimming and walking are generally safer activities.  Maintain good posture.  Maintain a healthy weight.  For acute conditions, you may put ice on the injured  area.  Put ice in a plastic bag.  Place a towel between your skin and the bag.  Leave the ice on for 20 minutes, 2-3 times a day.  When the low back starts healing, stretching and strengthening exercises may be recommended. SEEK MEDICAL CARE IF:  Your back pain is getting worse.  You experience severe back pain not relieved with medicines. SEEK IMMEDIATE MEDICAL CARE IF:   You have numbness, tingling, weakness, or problems with the use of your arms or legs.  There is a change in bowel or bladder control.  You have increasing pain in any area of the body, including your belly (abdomen).  You notice shortness of breath, dizziness, or feel faint.  You feel sick to your stomach (nauseous), are throwing up (vomiting), or become sweaty.  You notice discoloration of your toes or legs, or your feet get very cold. MAKE SURE YOU:   Understand these instructions.  Will watch your condition.  Will get help right away if you are not doing well or get worse.   This information is not intended to replace advice given to you by your health care provider. Make sure you discuss any questions you have with your health care provider.   Document Released: 05/24/2005 Document Revised: 09/04/2014 Document Reviewed: 04/02/2013 Elsevier Interactive Patient Education 2016 [place] your blood pressure at home. Go to the ER if any CP, SOB, nausea, dizziness, severe HA, changes vision/speech  Goal BP:  For patients younger than 60: Goal BP < 140/90. For patients 60 and older: Goal BP < 150/90. For patients with diabetes: Goal BP < 140/90. Your most recent  BP: BP: 140/90   Take your medications faithfully as instructed. Maintain a healthy weight. Get at least 150 minutes of aerobic exercise per week. Minimize salt intake. Minimize alcohol intake  DASH Eating Plan DASH stands for "Dietary Approaches to Stop Hypertension." The DASH eating plan is a healthy eating plan that has  been shown to reduce high blood pressure (hypertension). Additional health benefits may include reducing the risk of type 2 diabetes mellitus, heart disease, and stroke. The DASH eating plan may also help with weight loss. WHAT DO I NEED TO KNOW ABOUT THE DASH EATING PLAN? For the DASH eating plan, you will follow these general guidelines:  Choose foods with a percent daily value for sodium of less than 5% (as listed on the food label).  Use salt-free seasonings or herbs instead of table salt or sea salt.  Check with your health care provider or pharmacist before using salt substitutes.  Eat lower-sodium products, often labeled as "lower sodium" or "no salt added."  Eat fresh foods.  Eat more vegetables, fruits, and low-fat dairy products.  Choose whole grains. Look for the word "whole" as the first word in the ingredient list.  Choose fish and skinless chicken or [place] more often than red meat. Limit fish, poultry, and meat to 6 oz (170 g) each day.  Limit sweets, desserts, sugars, and sugary drinks.  Choose heart-healthy fats.  Limit cheese to 1 oz (28 g) per day.  Eat more home-cooked food and less restaurant, buffet, and fast food.  Limit fried foods.  Cook foods using methods other than frying.  Limit canned vegetables. If you do use them, rinse them well to decrease the sodium.  When eating at a restaurant, ask that your food be prepared with less salt, or no salt if possible. WHAT FOODS CAN I EAT? Seek help from a dietitian for individual calorie needs. Grains Whole grain or whole wheat bread. Brown rice. Whole grain or whole wheat pasta. Quinoa, bulgur, and whole grain cereals. Low-sodium cereals. Corn or whole wheat flour tortillas. Whole grain cornbread. Whole grain crackers. Low-sodium crackers. Vegetables Fresh or frozen vegetables (raw, steamed, roasted, or grilled). Low-sodium or reduced-sodium tomato and vegetable juices. Low-sodium or reduced-sodium tomato  sauce and paste. Low-sodium or reduced-sodium canned vegetables.  Fruits All fresh, canned (in natural juice), or frozen fruits. Meat and Other Protein Products Ground beef (85% or leaner), grass-fed beef, or beef trimmed of fat. Skinless chicken or [place]. Ground chicken or [place]. Pork trimmed of fat. All fish and seafood. Eggs. Dried beans, peas, or lentils. Unsalted nuts and seeds. Unsalted canned beans. Dairy Low-fat dairy products, such as skim or 1% milk, 2% or reduced-fat cheeses, low-fat ricotta or cottage cheese, or plain low-fat yogurt. Low-sodium or reduced-sodium cheeses. Fats and Oils Tub margarines without trans fats. Light or reduced-fat mayonnaise and salad dressings (reduced sodium). Avocado. Safflower, olive, or canola oils. Natural peanut or almond butter. Other Unsalted popcorn and pretzels. The items listed above may not be a complete list of recommended foods or beverages. Contact your dietitian for more options. WHAT FOODS ARE NOT RECOMMENDED? Grains White bread. White pasta. White rice. Refined cornbread. Bagels and croissants. Crackers that contain trans fat. Vegetables Creamed or fried vegetables. Vegetables in a cheese sauce. Regular canned vegetables. Regular canned tomato sauce and paste. Regular tomato and vegetable juices. Fruits Dried fruits. Canned fruit in light or heavy syrup. Fruit juice. Meat and Other Protein Products Fatty cuts of meat. Ribs, chicken wings, bacon, sausage, bologna,  salami, chitterlings, fatback, hot dogs, bratwurst, and packaged luncheon meats. Salted nuts and seeds. Canned beans with salt. Dairy Whole or 2% milk, cream, half-and-half, and cream cheese. Whole-fat or sweetened yogurt. Full-fat cheeses or blue cheese. Nondairy creamers and whipped toppings. Processed cheese, cheese spreads, or cheese curds. Condiments Onion and garlic salt, seasoned salt, table salt, and sea salt. Canned and packaged gravies. Worcestershire sauce. Tartar  sauce. Barbecue sauce. Teriyaki sauce. Soy sauce, including reduced sodium. Steak sauce. Fish sauce. Oyster sauce. Cocktail sauce. Horseradish. Ketchup and mustard. Meat flavorings and tenderizers. Bouillon cubes. Hot sauce. Tabasco sauce. Marinades. Taco seasonings. Relishes. Fats and Oils Butter, stick margarine, lard, shortening, ghee, and bacon fat. Coconut, palm kernel, or palm oils. Regular salad dressings. Other Pickles and olives. Salted popcorn and pretzels. The items listed above may not be a complete list of foods and beverages to avoid. Contact your dietitian for more information. WHERE CAN I FIND MORE INFORMATION? National [employer], [employer], [employer]: [employer]stabl[employer].com Document Released: 08/03/2011 Document Revised: 12/29/2013 Document Reviewed: 06/18/2013 [facility] Patient Information 2015 [place], [place]. This information is not intended to replace advice given to you by your health care provider. Make sure you discuss any questions you have with your health care provider.

## 2016-06-02 NOTE — Progress Notes (Signed)
MEDICARE WELLNESS VISIT AND FOLLOW UP Assessment:    Essential hypertension - will do coreg 12.5 1/2 BId, enalapril 20mg  1/2 BId, norvasc 5mg  start 1/2 daily and can increase to 1 pill if needed.  Call if greater than 130/80.   Hyperlipidemia -continue medications, check lipids, decrease fatty foods, increase activity.    BPH (benign prostatic hyperplasia) Declines symptoms  Vitamin D deficiency  Medication management  Noncompliance - emphasized compliance with medications and appointments, patient expressed understanding  Low back pain without sciatica, unspecified back pain laterality, unspecified chronicity -     DG Lumbar Spine Complete; Future -     Ambulatory referral to Physical Therapy   Over 30 minutes of exam, counseling, chart review, and critical decision making was performed  Plan:   During the course of the visit the patient was educated and counseled about appropriate screening and preventive services including:    Pneumococcal vaccine   Influenza vaccine  Td vaccine  Screening electrocardiogram  Colorectal cancer screening  Diabetes screening  Glaucoma screening  Nutrition counseling   Conditions/risks identified: BMI: Discussed weight loss, diet, and increase physical activity.  Increase physical activity: AHA recommends 150 minutes of physical activity a week.  Medications reviewed Urinary Incontinence is not an issue: discussed non pharmacology and pharmacology options.  Fall risk: low- discussed PT, home fall assessment, medications.   Future Appointments Date Time Provider [place]  08/23/2016 10:00 AM Vicie Mutters, PA-C [id number] None    Subjective:  [name] is a 65 y.o. male who presents for Medicare Wellness Visit and 3 month follow up for HTN, hyperlipidemia, and vitamin D Def.   His blood pressure has not been controlled at home, he has white coat, he takes enalapril 20mg  1/2 BId, coreg 12.5 BID, and no  other mediations, he does not follow recommendations and takes the medications how he wants,  today their BP is BP: 140/90.   He does workout, yoga and walks. He denies chest pain, shortness of breath, dizziness.  He is not on cholesterol medication and denies myalgias. His cholesterol is at goal. The cholesterol last visit was:   Lab Results  Component Value Date   CHOL 174 08/19/2015   HDL 56 08/19/2015   LDLCALC 99 08/19/2015   TRIG 97 08/19/2015   CHOLHDL 3.1 08/19/2015   He is not prediabetic.  Patient is on Vitamin D supplement.   Lab Results  Component Value Date   VD25OH 12 (L) 08/19/2015   He teaches yoga.   He has history of BPH, states doxazosin causes stomach pain.   Medication Review: Current Outpatient Prescriptions on File Prior to Visit  Medication Sig Dispense Refill  . B Complex Vitamins (VITAMIN B COMPLEX PO) Take by mouth.    . doxazosin (CARDURA) 8 MG tablet Take 1/4 tablet = (2 mg)    2 x day -  AM & PM 90 tablet 0  . enalapril (VASOTEC) 20 MG tablet Take 1/2 tablet at nite for BP 90 tablet 0  . FLUZONE QUADRIVALENT injection     . loratadine (CLARITIN) 10 MG tablet Take 1 tablet (10 mg total) by mouth daily. 90 tablet 1  . vitamin C (ASCORBIC ACID) 500 MG tablet Take 500 mg by mouth daily.    Marland Kitchen VITAMIN E PO Take by mouth daily.    Marland Kitchen amLODipine (NORVASC) 5 MG tablet Take 1 tablet daily for BP 15 tablet 0  . bisoprolol-hydrochlorothiazide (ZIAC) 5-6.25 MG tablet Take 1 tablet every am  for BP 90 tablet 1   No current facility-administered medications on file prior to visit.     Current Problems (verified) Patient Active Problem List   Diagnosis Date Noted  . Routine general medical examination at a health care facility 08/19/2015  . BPH (benign prostatic hyperplasia) 03/05/2015  . Medication management 08/12/2014  . Hyperlipidemia   . Hypertension   . Vitamin D deficiency     Screening Tests Immunization History  Administered Date(s) Administered   . Influenza-Unspecified 05/17/2014  . Td 11/15/2007   Preventative care: Tetanus: 2009 Pneumovax: declines Prevnar 13: out of the office Flu vaccine: 2015 DUE but declines Zostavax: declines DEXA: N/A Colonoscopy:2008 due 2018, would like done sooner , will call EGD N/A:  Names of Other Physician/Practitioners you currently use: 1. [facility] here for primary care 2. Dr. Marvel Plan, eye doctor, last visit 3 years 3. Dr. Jarrett Soho, dentist, last visit 2017 Patient Care Team: Unk Pinto, MD as PCP - General (Internal Medicine) Ladene Artist, MD as Consulting Physician (Gastroenterology) Agapito Games (Optometry) Jarome Matin, MD as Consulting Physician (Dermatology)  Allergies Allergies  Allergen Reactions  . Penicillins     SURGICAL HISTORY He  has a past surgical history that includes Tonsillectomy; Inguinal hernia repair; and Nasal septum surgery. FAMILY HISTORY His family history includes Cancer in his mother; Heart attack in his brother; Stroke in his father. SOCIAL HISTORY He  reports that he quit smoking about 44 years ago. He has never used smokeless tobacco. He reports that he does not drink alcohol or use drugs.  MEDICARE WELLNESS OBJECTIVES: Diet: in general, a "healthy" diet   Physical activity: walking and exercise class Depression/mood screen:   Depression screen [facility] 2/9 03/05/2015  Decreased Interest 0  Down, Depressed, Hopeless 0  PHQ - 2 Score 0   Hearing: impaired Visual acuity: normal,  does not perform annual eye exam  ADLs:  No flowsheet data found.  Fall risk: Low Risk Cognitive Testing  Alert? Yes  Normal Appearance?Yes  Oriented to person? Yes  Place? Yes   Time? Yes  Recall of three objects?  Yes  Can perform simple calculations? Yes  Displays appropriate judgment?Yes  Can read the correct time from a watch face?Yes  EOL planning: Does patient have an advance directive?: Yes Copy of  advanced directive(s) in chart?: No - copy requested    Objective:   Blood pressure 140/90, pulse 65, temperature 97.7 F (36.5 C), height 5' 9.5" (1.765 m), weight 168 lb (76.2 kg), SpO2 98 %. Body mass index is 24.45 kg/m.  General appearance: alert, no distress, WD/WN, male Eyes: PERRLA, EOMs, conjunctiva no swelling or erythema, normal fundi and vessels.  Sinuses: No Frontal/maxillary tenderness  ENT/Mouth: Ext aud canals clear, normal light reflex with TMs without erythema, bulging. Good dentition. No erythema, swelling, or exudate on post pharynx. Tonsils not swollen or erythematous. Hearing normal.  Neck: Supple, thyroid normal. No bruits.  Respiratory: Respiratory effort normal, BS equal bilaterally without rales, rhonchi, wheezing or stridor.  Cardio: RRR without murmurs, rubs or gallops. Brisk peripheral pulses without edema.  Chest: symmetric, with normal excursions and percussion.  Abdomen: Soft, nontender, no guarding, rebound, masses, or organomegaly. Lymphatics: Non tender without lymphadenopathy.  Genitourinary: defer Musculoskeletal: Full ROM all peripheral extremities,5/5 strength, and normal gait.  Skin: Warm, dry without rashes, lesions, ecchymosis.  Neuro: Cranial nerves intact, reflexes equal bilaterally. Normal muscle tone, no cerebellar symptoms. Sensation intact.  Psych: Awake and oriented X 3,  normal affect, Insight and Judgment appropriate.   Medicare Attestation I have personally reviewed: The patient's medical and social history Their use of alcohol, tobacco or illicit drugs Their current medications and supplements The patient's functional ability including ADLs,fall risks, home safety risks, cognitive, and hearing and visual impairment Diet and physical activities Evidence for depression or mood disorders  The patient's weight, height, BMI, and visual acuity have been recorded in the chart.  I have made referrals, counseling, and provided education to  the patient based on review of the above and I have provided the patient with a written personalized care plan for preventive services.     Vicie Mutters, PA-C   06/02/2016

## 2016-06-06 NOTE — Addendum Note (Signed)
Addended by: Vicie Mutters R on: 06/06/2016 12:20 PM   Modules accepted: Orders

## 2016-07-14 ENCOUNTER — Encounter: Payer: Self-pay | Admitting: Nurse Practitioner

## 2016-07-14 ENCOUNTER — Encounter: Payer: Commercial Managed Care - HMO | Admitting: Nurse Practitioner

## 2016-07-14 NOTE — Progress Notes (Signed)
Pre visit review using our clinic review tool, if applicable. No additional management support is needed unless otherwise documented below in the visit note. 

## 2016-07-17 NOTE — Progress Notes (Signed)
This encounter was created in error - please disregard.

## 2016-07-25 ENCOUNTER — Ambulatory Visit: Payer: Commercial Managed Care - HMO | Admitting: Family Medicine

## 2016-07-26 ENCOUNTER — Ambulatory Visit (INDEPENDENT_AMBULATORY_CARE_PROVIDER_SITE_OTHER): Payer: Commercial Managed Care - HMO | Admitting: Family Medicine

## 2016-07-26 ENCOUNTER — Encounter: Payer: Self-pay | Admitting: Family Medicine

## 2016-07-26 VITALS — BP 156/88 | HR 64 | Resp 12 | Ht 69.5 in | Wt 164.4 lb

## 2016-07-26 DIAGNOSIS — M545 Low back pain: Secondary | ICD-10-CM | POA: Diagnosis not present

## 2016-07-26 DIAGNOSIS — E559 Vitamin D deficiency, unspecified: Secondary | ICD-10-CM | POA: Diagnosis not present

## 2016-07-26 DIAGNOSIS — I10 Essential (primary) hypertension: Secondary | ICD-10-CM | POA: Diagnosis not present

## 2016-07-26 DIAGNOSIS — G8929 Other chronic pain: Secondary | ICD-10-CM

## 2016-07-26 LAB — BASIC METABOLIC PANEL
BUN: 13 mg/dL (ref 6–23)
CALCIUM: 9.2 mg/dL (ref 8.4–10.5)
CO2: 31 mEq/L (ref 19–32)
Chloride: 102 mEq/L (ref 96–112)
Creatinine, Ser: 0.9 mg/dL (ref 0.40–1.50)
GFR: 89.37 mL/min (ref >60.00)
Glucose, Bld: 92 mg/dL (ref 70–99)
Potassium: 3.2 mEq/L — ABNORMAL LOW (ref 3.5–5.1)
SODIUM: 140 meq/L (ref 135–145)

## 2016-07-26 LAB — VITAMIN D 25 HYDROXY (VIT D DEFICIENCY, FRACTURES): VITD: 38.22 ng/mL (ref 30.00–100.00)

## 2016-07-26 NOTE — Progress Notes (Signed)
Pre visit review using our clinic review tool, if applicable. No additional management support is needed unless otherwise documented below in the visit note. 

## 2016-07-26 NOTE — Patient Instructions (Addendum)
A few things to remember from today's visit:   Essential hypertension  Hyperlipidemia, unspecified hyperlipidemia type - Plan: Basic metabolic panel  Vitamin D deficiency - Plan: VITAMIN D 25 Hydroxy (Vit-D Deficiency, Fractures)  Chronic midline low back pain without sciatica - Plan: Ambulatory referral to Physical Therapy  Vasotec 1/2 tab  2 times daily. Rest continue taking it as you are doing now.   Blood pressure goal for most people is less than 140/90.  Elevated blood pressure increases the risk of strokes, heart and kidney disease, and eye problems. Regular physical activity and a healthy diet (DASH diet) usually help. Low salt diet. Take medications as instructed. Caution with some over the counter medications as cold medications, dietary products (for weight loss), and Ibuprofen or Aleve (frequent use);all these medications could cause elevation of blood pressure.   Please be sure medication list is accurate. If a new problem present, please set up appointment sooner than planned today.
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
## 2016-08-08 ENCOUNTER — Other Ambulatory Visit: Payer: Self-pay | Admitting: Physician Assistant

## 2016-08-08 DIAGNOSIS — I10 Essential (primary) hypertension: Secondary | ICD-10-CM

## 2016-08-14 ENCOUNTER — Other Ambulatory Visit: Payer: Self-pay | Admitting: Physician Assistant

## 2016-08-15 ENCOUNTER — Other Ambulatory Visit: Payer: Self-pay

## 2016-08-15 DIAGNOSIS — I10 Essential (primary) hypertension: Secondary | ICD-10-CM

## 2016-08-15 MED ORDER — AMLODIPINE BESYLATE 5 MG PO TABS: ORAL_TABLET | ORAL | 1 refills | Status: DC

## 2016-08-15 MED ORDER — CARVEDILOL 12.5 MG PO TABS: 12.5000 mg | ORAL_TABLET | Freq: Two times a day (BID) | ORAL | 1 refills | Status: DC

## 2016-08-16 ENCOUNTER — Encounter: Payer: Self-pay | Admitting: Family Medicine

## 2016-08-16 ENCOUNTER — Ambulatory Visit (INDEPENDENT_AMBULATORY_CARE_PROVIDER_SITE_OTHER): Payer: Commercial Managed Care - HMO | Admitting: Family Medicine

## 2016-08-16 VITALS — BP 122/80 | HR 78 | Resp 12 | Ht 69.5 in | Wt 167.4 lb

## 2016-08-16 DIAGNOSIS — Z125 Encounter for screening for malignant neoplasm of prostate: Secondary | ICD-10-CM

## 2016-08-16 DIAGNOSIS — Z13220 Encounter for screening for lipoid disorders: Secondary | ICD-10-CM | POA: Diagnosis not present

## 2016-08-16 DIAGNOSIS — E876 Hypokalemia: Secondary | ICD-10-CM

## 2016-08-16 DIAGNOSIS — Z0000 Encounter for general adult medical examination without abnormal findings: Secondary | ICD-10-CM | POA: Diagnosis not present

## 2016-08-16 LAB — LIPID PANEL
CHOL/HDL RATIO: 3
Cholesterol: 159 mg/dL (ref 0–200)
HDL: 52.4 mg/dL (ref >39.00)
LDL CALC: 87 mg/dL (ref 0–99)
NONHDL: 107
TRIGLYCERIDES: 102 mg/dL (ref 0.0–149.0)
VLDL: 20.4 mg/dL (ref 0.0–40.0)

## 2016-08-16 LAB — POTASSIUM: Potassium: 3.4 mEq/L — ABNORMAL LOW (ref 3.5–5.1)

## 2016-08-16 LAB — PSA: PSA: 1.14 ng/mL (ref 0.10–4.00)

## 2016-08-16 NOTE — Patient Instructions (Addendum)
A few things to remember from today's visit:   Lipid screening - Plan: Lipid panel  Routine general medical examination at a health care facility - Plan: Lipid panel, PSA  Prostate cancer screening - Plan: PSA    At least 150 minutes of moderate exercise per week, daily brisk walking for 15-30 min is a good exercise option. Healthy diet low in saturated (animal) fats and sweets and consisting of fresh fruits and vegetables, lean meats such as fish and white chicken and whole grains.  - Vaccines:  Tdap vaccine every 10 years.  Shingles vaccine recommended at age 75, could be given after 67 years of age but not sure about insurance coverage. Needed.  Pneumonia vaccines:  Prevnar 13 at 65 and Pneumovax at 21. Needed.  -Screening recommendations for low/normal risk males:  Screening for diabetes at age 45-45 and every 3 years.    Lipid screening at 35 and every 3 years.   Colonoscopy for colon cancer screening at age 41 and until age 70.  Prostate cancer screening: some controversy.        Please be sure medication list is accurate. If a new problem present, please set up appointment sooner than planned today.

## 2016-08-16 NOTE — Progress Notes (Signed)
HPI:  [name] is a 65 y.o.male here today for his routine physical examination.  He lives alone. Independent ADL's and IADL's. No falls in the past year, denies depression symptoms.  He works part time at [employer], usually seasonal, 10,000-12,000 steps in a normal work day. He does yoga.  Some hearing loss,usually worse with background noise. He has a living will and POA.    He does not follow a healthy.   Chronic medical problems: HTN, vit D deficiency,chronic back pain and OA. Last OV, 07/26/16, he was referred to PT. He has not ben able to schedule visit because work schedule. Last labs done 07/26/16 K+ was low at 3.2, he increased K+ intake through diet.  Hx of STD's: Many years ago she thinks he was treated for gonorrhea, 1972.  He is not sexually active since 03/2016.   -Hep C screening: Negative 07/2015.   Last colon cancer screening: 11/2006. Last prostate ca screening: 07/2015. Urine frequency depending of how much water and nocturia if he drinks fluids at bed time.    -Denies high alcohol intake or Hx of illicit drug use. -Former smoker (quit in 1973). -Concerns and/or follow up today: None    Review of Systems  Constitutional: Negative for activity change, appetite change, fatigue, fever and unexpected weight change.  HENT: Positive for hearing loss. Negative for dental problem, mouth sores, nosebleeds, sore throat, trouble swallowing and voice change.   Eyes: Negative for redness and visual disturbance.  Respiratory: Negative for apnea, cough, shortness of breath and wheezing.   Cardiovascular: Negative for chest pain, palpitations and leg swelling.  Gastrointestinal: Negative for abdominal pain, blood in stool, nausea and vomiting.  Endocrine: Negative for cold intolerance, heat intolerance, polydipsia, polyphagia and polyuria.  Genitourinary: Negative for decreased urine volume, difficulty urinating, dysuria, genital sores, hematuria  and testicular pain.  Musculoskeletal: Positive for arthralgias and back pain. Negative for myalgias.  Skin: Negative for color change and rash.  Neurological: Negative for syncope, weakness, numbness and headaches.  Hematological: Negative for adenopathy. Does not bruise/bleed easily.  Psychiatric/Behavioral: Negative for confusion and sleep disturbance. The patient is nervous/anxious.      Current Outpatient Prescriptions on File Prior to Visit  Medication Sig Dispense Refill  . amLODipine (NORVASC) 5 MG tablet Take 1 tablet daily for BP 90 tablet 1  . B Complex Vitamins (VITAMIN B COMPLEX PO) Take by mouth.    . carvedilol (COREG) 12.5 MG tablet Take 1 tablet (12.5 mg total) by mouth 2 (two) times daily. 180 tablet 1  . doxazosin (CARDURA) 8 MG tablet     . enalapril (VASOTEC) 20 MG tablet Take 1/2 tablet at nite for BP 90 tablet 0  . loratadine (CLARITIN) 10 MG tablet Take 1 tablet (10 mg total) by mouth daily. 90 tablet 1  . vitamin C (ASCORBIC ACID) 500 MG tablet Take 500 mg by mouth daily.    Marland Kitchen VITAMIN E PO Take by mouth daily.     No current facility-administered medications on file prior to visit.      Past Medical History:  Diagnosis Date  . Diverticulosis   . Hyperlipidemia   . Hypertension   . Internal hemorrhoid   . Vitamin D deficiency     Allergies  Allergen Reactions  . Penicillins     Family History  Problem Relation Age of Onset  . Stroke Father   . Cancer Mother     Brain  . Heart attack Brother  Social History   Social History  . Marital status: Single    Spouse name: N/A  . Number of children: N/A  . Years of education: N/A   Social History Main Topics  . Smoking status: Former Smoker    Quit date: 08/29/1971  . Smokeless tobacco: Never Used  . Alcohol use No  . Drug use: No  . Sexual activity: Not Asked   Other Topics Concern  . None   Social History Narrative  . None     Vitals:   08/16/16 0902  BP: 122/80  Pulse: 78    Resp: 12   Body mass index is 24.36 kg/m.  O2 sat at RA 95%  Wt Readings from Last 3 Encounters:  08/16/16 167 lb 6 oz (75.9 kg)  07/26/16 164 lb 6 oz (74.6 kg)  07/14/16 163 lb (73.9 kg)      Physical Exam  Nursing note and vitals reviewed. Constitutional: He is oriented to person, place, and time. He appears well-developed and well-nourished. No distress.  HENT:  Head: Atraumatic.  Mouth/Throat: Oropharynx is clear and moist and mucous membranes are normal.  Excess cerumen bilateral, left TM seen partially.  Eyes: Conjunctivae and EOM are normal. Pupils are equal, round, and reactive to light.  Neck: Normal range of motion. No thyromegaly present.  Cardiovascular: Normal rate and regular rhythm.   No murmur heard. Pulses:      Dorsalis pedis pulses are 2+ on the right side, and 2+ on the left side.  Respiratory: Effort normal and breath sounds normal. No respiratory distress.  GI: Soft. He exhibits no mass. There is no hepatomegaly. There is no tenderness. Hernia confirmed negative in the right inguinal area and confirmed negative in the left inguinal area.  Genitourinary: Penis normal. Rectal exam shows external hemorrhoid. Rectal exam shows no mass and no tenderness. Prostate is enlarged (mild, no nodules.). Prostate is not tender. Right testis shows no mass and no tenderness. Left testis shows no mass and no tenderness.  Musculoskeletal: He exhibits no edema or tenderness.  No major deformities appreciated and no signs of synovitis.  Lymphadenopathy:    He has no cervical adenopathy.       Right: No supraclavicular adenopathy present.       Left: No supraclavicular adenopathy present.  Neurological: He is alert and oriented to person, place, and time. He has normal strength. No cranial nerve deficit or sensory deficit. Coordination and gait normal.  Reflex Scores:      Bicep reflexes are 2+ on the right side and 2+ on the left side.      Patellar reflexes are 2+ on the  right side and 2+ on the left side. Skin: Skin is warm. No erythema.  Psychiatric: He has a normal mood and affect. Cognition and memory are normal.  Well groomed, good eye contact.         ASSESSMENT AND PLAN:   Discussed the following assessment and plan:   [name] was seen today for annual exam.  Diagnoses and all orders for this visit:  Routine general medical examination at a health care facility   We discussed the importance of regular physical activity and healthy diet for prevention of chronic illness and/or complications. Preventive guidelines reviewed. Refused Prevnar 13 and Zoster vaccine. Fall precautions discussed. Cognitive function intact based on observation during OV. Next CPE in 1years.   -     Lipid panel -     PSA  Lipid screening -  Lipid panel  Prostate cancer screening -     PSA  Hypokalemia  No changes in current management, will follow labs done today and will give further recommendations accordingly.  -     Potassium        Return in 4 months (on 12/15/2016) for HTN.    Kristan Brummitt G. Martinique, MD  [facility]. [place] office.

## 2016-08-16 NOTE — Progress Notes (Signed)
Pre visit review using our clinic review tool, if applicable. No additional management support is needed unless otherwise documented below in the visit note. 

## 2016-08-18 ENCOUNTER — Telehealth: Payer: Self-pay | Admitting: Family Medicine

## 2016-08-22 NOTE — Telephone Encounter (Signed)
Pt states the lab results he picked up were from his first visit, and not labs from the pther day. Pt would like a complete copy of labs from 12/20.  Pt will be here tomorrow.

## 2016-08-22 NOTE — Telephone Encounter (Signed)
Labs reprinted and placed in envelope. Put up front for patient to pick up.

## 2016-08-23 ENCOUNTER — Encounter: Payer: Self-pay | Admitting: Physician Assistant

## 2016-09-11 ENCOUNTER — Telehealth: Payer: Self-pay | Admitting: Family Medicine

## 2016-09-11 NOTE — Telephone Encounter (Signed)
Left voicemail for patient to call the office back.   

## 2016-09-11 NOTE — Telephone Encounter (Signed)
Called and spoke with patient. Went over lab results with him and patient verbalized understanding.

## 2016-09-11 NOTE — Telephone Encounter (Signed)
Pt trying to understand his lipid panel results, would like a call back to explain the reference ranges ;

## 2016-09-11 NOTE — Telephone Encounter (Signed)
Pt would like blood work result  °

## 2016-10-09 ENCOUNTER — Encounter: Payer: Self-pay | Admitting: Gastroenterology

## 2016-10-24 ENCOUNTER — Other Ambulatory Visit: Payer: Self-pay | Admitting: Physician Assistant

## 2016-10-24 DIAGNOSIS — I10 Essential (primary) hypertension: Secondary | ICD-10-CM

## 2016-12-18 ENCOUNTER — Ambulatory Visit: Payer: Medicare PPO | Admitting: Family Medicine

## 2016-12-21 ENCOUNTER — Other Ambulatory Visit: Payer: Self-pay

## 2016-12-21 DIAGNOSIS — I10 Essential (primary) hypertension: Secondary | ICD-10-CM

## 2016-12-21 MED ORDER — ENALAPRIL MALEATE 20 MG PO TABS: 20.0000 mg | ORAL_TABLET | Freq: Every day | ORAL | 1 refills | Status: DC

## 2017-01-02 ENCOUNTER — Other Ambulatory Visit: Payer: Self-pay | Admitting: Family Medicine

## 2017-01-02 DIAGNOSIS — I10 Essential (primary) hypertension: Secondary | ICD-10-CM

## 2017-01-03 ENCOUNTER — Other Ambulatory Visit: Payer: Self-pay

## 2017-01-03 DIAGNOSIS — I10 Essential (primary) hypertension: Secondary | ICD-10-CM

## 2017-01-03 MED ORDER — ENALAPRIL MALEATE 20 MG PO TABS: 20.0000 mg | ORAL_TABLET | Freq: Every day | ORAL | 1 refills | Status: DC

## 2017-05-17 ENCOUNTER — Telehealth: Payer: Self-pay | Admitting: Family Medicine

## 2017-05-17 NOTE — Telephone Encounter (Signed)
° ° ° °  [name] is requesting a call back. He met a young lady and she want him him to verify that he has no STD's    [phone]

## 2017-05-18 NOTE — Telephone Encounter (Signed)
Please arrange appt for STD check.This can be done today if needed. Thanks

## 2017-05-18 NOTE — Telephone Encounter (Signed)
Pt has been scheduled.  °

## 2017-05-18 NOTE — Telephone Encounter (Signed)
Does patient need an appointment or can he do a urine at the lab?

## 2017-05-20 NOTE — Progress Notes (Deleted)
    Mr. MATTEUS MCNELLY is a 66 y.o.male, who is here today *** to follow on HTN.  Currently he is on Coreg 12.5 mg bid, Enalapril 20 mg daily, Amlodipine 5 mg daily, and Doxazosin 8 mg daily. . ***taking medications as instructed, no side effects reported.  BP readings at home: *** Last eye exam: *** ***has not noted unusual headache, visual changes, exertional chest pain, dyspnea,  focal weakness, or edema.    Lab Results  Component Value Date   CREATININE 0.90 07/26/2016   BUN 13 07/26/2016   NA 140 07/26/2016   K 3.4 (L) 08/16/2016   CL 102 07/26/2016   CO2 31 07/26/2016      Review of Systems   Current Outpatient Prescriptions on File Prior to Visit  Medication Sig Dispense Refill  . amLODipine (NORVASC) 5 MG tablet TAKE 1 TABLET ONE TIME DAILY FOR BLOOD PRESSURE 90 tablet 2  . B Complex Vitamins (VITAMIN B COMPLEX PO) Take by mouth.    . carvedilol (COREG) 12.5 MG tablet TAKE 1 TABLET (12.5 MG TOTAL) BY MOUTH 2 (TWO) TIMES DAILY. 180 tablet 2  . doxazosin (CARDURA) 8 MG tablet     . enalapril (VASOTEC) 20 MG tablet Take 1 tablet (20 mg total) by mouth daily. 90 tablet 1  . loratadine (CLARITIN) 10 MG tablet Take 1 tablet (10 mg total) by mouth daily. 90 tablet 1  . vitamin C (ASCORBIC ACID) 500 MG tablet Take 500 mg by mouth daily.    Marland Kitchen VITAMIN E PO Take by mouth daily.     No current facility-administered medications on file prior to visit.      Past Medical History:  Diagnosis Date  . Diverticulosis   . Hyperlipidemia   . Hypertension   . Internal hemorrhoid   . Vitamin D deficiency     Allergies  Allergen Reactions  . Penicillins     Social History   Social History  . Marital status: Single    Spouse name: N/A  . Number of children: N/A  . Years of education: N/A   Social History Main Topics  . Smoking status: Former Smoker    Quit date: 08/29/1971  . Smokeless tobacco: Never Used  . Alcohol use No  . Drug use: No  . Sexual activity: Not  on file   Other Topics Concern  . Not on file   Social History Narrative  . No narrative on file    There were no vitals filed for this visit. There is no height or weight on file to calculate BMI.    Physical Exam  ASSESSMENT AND PLAN:   There are no diagnoses linked to this encounter.    -Mr. GWENDOLYN NISHI advised to return sooner than planned today if new concerns arise.     Rayson Rando G. Martinique, MD  [facility]. [place] office.

## 2017-05-21 ENCOUNTER — Ambulatory Visit: Payer: Medicare PPO | Admitting: Family Medicine

## 2017-05-21 ENCOUNTER — Ambulatory Visit (INDEPENDENT_AMBULATORY_CARE_PROVIDER_SITE_OTHER): Payer: Medicare PPO | Admitting: Family Medicine

## 2017-05-21 ENCOUNTER — Encounter: Payer: Self-pay | Admitting: Family Medicine

## 2017-05-21 VITALS — BP 120/78 | HR 67 | Resp 12 | Ht 69.5 in | Wt 173.3 lb

## 2017-05-21 DIAGNOSIS — Z202 Contact with and (suspected) exposure to infections with a predominantly sexual mode of transmission: Secondary | ICD-10-CM

## 2017-05-21 NOTE — Patient Instructions (Addendum)
A few things to remember from today's visit:   Possible exposure to STD - Plan: Chlamydia/Gonococcus/Trichomonas, NAA   Please be sure medication list is accurate. If a new problem present, please set up appointment sooner than planned today.

## 2017-05-21 NOTE — Progress Notes (Signed)
ACUTE VISIT   HPI:  Chief Complaint  Patient presents with  . STD check    [name] is a 66 y.o. male, who is here today requesting STD testing.  He starts visit telling me that he met a male in a golf tournament about 2 months ago, she is from [place] and have been seeing each other and going out a few times.  A few days ago he tried to kiss her but she did not allow him to do so. According to patient, she asked him to "get check" for STD's before going any further in their relation. She will do the same and they are planning on exchanging lab results.  She denies any prior history of STDs or known exposure.  He denies any sex intercourse since he broke up with his girlfriend 4-5 months ago. He did not use condoms.  He does not think he needs to be screened because he is certain he does not have any STD.  He has never had HIV test done. HCV negative in 2016. He has had 2 sex partners in his life with Hx of genital herpes. He denies ever having a genital lesion and did avoid sex intercourse when they were symptomatic.   Other  Pertinent negatives include no abdominal pain, arthralgias, chills, fatigue, fever, headaches, joint swelling, myalgias, nausea, numbness, rash, sore throat, swollen glands, urinary symptoms, vomiting or weakness.  Denies urethral discharge or dysuria. Denies illicit drug use.   Review of Systems  Constitutional: Negative for appetite change, chills, fatigue and fever.  HENT: Negative for mouth sores, nosebleeds, sore throat and trouble swallowing.   Gastrointestinal: Negative for abdominal pain, nausea and vomiting.       No changes in bowel habits.  Genitourinary: Negative for decreased urine volume, discharge, dysuria, hematuria, penile swelling and testicular pain.  Musculoskeletal: Negative for arthralgias, joint swelling and myalgias.  Skin: Negative for rash and wound.  Neurological: Negative for tremors, weakness,  numbness and headaches.  Hematological: Negative for adenopathy. Does not bruise/bleed easily.  Psychiatric/Behavioral: Negative for confusion.     Current Outpatient Prescriptions on File Prior to Visit  Medication Sig Dispense Refill  . amLODipine (NORVASC) 5 MG tablet TAKE 1 TABLET ONE TIME DAILY FOR BLOOD PRESSURE 90 tablet 2  . B Complex Vitamins (VITAMIN B COMPLEX PO) Take by mouth.    . carvedilol (COREG) 12.5 MG tablet TAKE 1 TABLET (12.5 MG TOTAL) BY MOUTH 2 (TWO) TIMES DAILY. 180 tablet 2  . doxazosin (CARDURA) 8 MG tablet     . enalapril (VASOTEC) 20 MG tablet Take 1 tablet (20 mg total) by mouth daily. 90 tablet 1  . vitamin C (ASCORBIC ACID) 500 MG tablet Take 500 mg by mouth daily.    Marland Kitchen VITAMIN E PO Take by mouth daily.    Marland Kitchen loratadine (CLARITIN) 10 MG tablet Take 1 tablet (10 mg total) by mouth daily. 90 tablet 1   No current facility-administered medications on file prior to visit.      Past Medical History:  Diagnosis Date  . Diverticulosis   . Hyperlipidemia   . Hypertension   . Internal hemorrhoid   . Vitamin D deficiency    Allergies  Allergen Reactions  . Penicillins     Social History   Social History  . Marital status: Single    Spouse name: N/A  . Number of children: N/A  . Years of education: N/A   Social History  Main Topics  . Smoking status: Former Smoker    Quit date: 08/29/1971  . Smokeless tobacco: Never Used  . Alcohol use No  . Drug use: No  . Sexual activity: Not Asked   Other Topics Concern  . None   Social History Narrative  . None    Vitals:   05/21/17 1428  BP: 120/78  Pulse: 67  Resp: 12  SpO2: 96%   Body mass index is 25.23 kg/m.   Physical Exam  Nursing note and vitals reviewed. Constitutional: He is oriented to person, place, and time. He appears well-developed and well-nourished. No distress.  HENT:  Head: Normocephalic and atraumatic.  Mouth/Throat: Oropharynx is clear and moist and mucous membranes are  normal.  Eyes: Conjunctivae and EOM are normal.  Cardiovascular: Normal rate and regular rhythm.   Respiratory: Effort normal and breath sounds normal. No respiratory distress.  GI: Soft. He exhibits no mass. There is no hepatomegaly. There is no tenderness. There is no CVA tenderness. Hernia confirmed negative in the right inguinal area and confirmed negative in the left inguinal area.  Genitourinary: No penile erythema or penile tenderness. No discharge found.  Genitourinary Comments: Urethral swab collected.  Musculoskeletal: He exhibits no edema or tenderness.  No signs of synovitis.  Lymphadenopathy:    He has no cervical adenopathy.       Right: No inguinal adenopathy present.       Left: No inguinal adenopathy present.  Neurological: He is alert and oriented to person, place, and time. He has normal strength. Coordination and gait normal.  Skin: Skin is warm. No rash noted. No erythema.  Psychiatric: His mood appears anxious.  Well groomed, good eye contact.    ASSESSMENT AND PLAN:   Anubis was seen today for std check.  Diagnoses and all orders for this visit:  Possible exposure to STD -     Chlamydia/Gonococcus/Trichomonas, NAA   Asymptomatic and no known exposure, so no specific treatment recommended at this time. He wonders about need of herpes blood test, explained it is not recommended for screening and can be positive due to prior exposure. His friend did not ask for specific test, I recommend HIV,RPR, and urethral swab.    2:35 pm to 3:06 pm OV. > 50% was dedicated to counseling about STD's in general, transmission, symptoms,and purpose of screening. He is upset that he was asked to be evaluated for STD's and does not want blood tests done. STD prevention, condom use encouraged. If he decides to have blood test, we will arrange lab visit.    Betty G. Martinique, MD  [facility]. [place] office.

## 2017-05-22 ENCOUNTER — Telehealth: Payer: Self-pay | Admitting: Family Medicine

## 2017-05-22 NOTE — Telephone Encounter (Signed)
Results are not back at this time 

## 2017-05-22 NOTE — Telephone Encounter (Signed)
° ° ° ° °  Pt call to follow up on a test he had yesterday. Michela Pitcher it was a swab

## 2017-05-23 LAB — CHLAMYDIA/GONOCOCCUS/TRICHOMONAS, NAA
CHLAMYDIA BY NAA: NEGATIVE
Gonococcus by NAA: NEGATIVE
TRICH VAG BY NAA: NEGATIVE

## 2017-05-24 ENCOUNTER — Telehealth: Payer: Self-pay | Admitting: Family Medicine

## 2017-05-24 NOTE — Telephone Encounter (Signed)
Pt would like you to print out a copy of the results from labs and he would like to pick up tomorrow morning.

## 2017-05-25 NOTE — Telephone Encounter (Signed)
Lab printed and placed up front for patient to pick up.

## 2017-05-25 NOTE — Telephone Encounter (Signed)
See other phone note/result note.

## 2017-07-09 ENCOUNTER — Other Ambulatory Visit: Payer: Self-pay | Admitting: Family Medicine

## 2017-07-09 DIAGNOSIS — I10 Essential (primary) hypertension: Secondary | ICD-10-CM

## 2017-08-27 ENCOUNTER — Encounter: Payer: Self-pay | Admitting: Physician Assistant

## 2017-08-28 DIAGNOSIS — I639 Cerebral infarction, unspecified: Secondary | ICD-10-CM

## 2017-08-28 HISTORY — DX: Cerebral infarction, unspecified: I63.9

## 2017-09-11 ENCOUNTER — Ambulatory Visit: Payer: Medicare HMO

## 2017-09-11 NOTE — Progress Notes (Deleted)
Subjective:   [name] is a 67 y.o. male who presents for an Initial Medicare Annual Wellness Visit.  General exam 07/2016  Diet Ratio 3 chol/hdl  Exercise    Health Maintenance Due  Topic Date Due  . PNA vac Low Risk Adult (1 of 2 - PCV13) 03/29/2014  . INFLUENZA VACCINE  03/28/2017   PSA 07/2016 Colonoscopy 11/2006 Former smoker Quit in 21   Shingrix    Objective:    There were no vitals filed for this visit. There is no height or weight on file to calculate BMI.  Advanced Directives 06/02/2016 03/05/2015  Does Patient Have a Medical Advance Directive? Yes Yes  Type of Advance Directive - Living will  Does patient want to make changes to medical advance directive? - No - Patient declined  Copy of [place] in Chart? No - copy requested No - copy requested    Current Medications (verified) Outpatient Encounter Medications as of 09/11/2017  Medication Sig  . amLODipine (NORVASC) 5 MG tablet TAKE 1 TABLET ONE TIME DAILY FOR BLOOD PRESSURE  . B Complex Vitamins (VITAMIN B COMPLEX PO) Take by mouth.  . carvedilol (COREG) 12.5 MG tablet TAKE 1 TABLET (12.5 MG TOTAL) BY MOUTH 2 (TWO) TIMES DAILY.  Marland Kitchen doxazosin (CARDURA) 8 MG tablet   . enalapril (VASOTEC) 20 MG tablet TAKE 1 TABLET EVERY DAY  . loratadine (CLARITIN) 10 MG tablet Take 1 tablet (10 mg total) by mouth daily.  . vitamin C (ASCORBIC ACID) 500 MG tablet Take 500 mg by mouth daily.  Marland Kitchen VITAMIN E PO Take by mouth daily.   No facility-administered encounter medications on file as of 09/11/2017.     Allergies (verified) Penicillins   History: Past Medical History:  Diagnosis Date  . Diverticulosis   . Hyperlipidemia   . Hypertension   . Internal hemorrhoid   . Vitamin D deficiency    Past Surgical History:  Procedure Laterality Date  . INGUINAL HERNIA REPAIR    . NASAL SEPTUM SURGERY    . TONSILLECTOMY     Family History  Problem Relation Age of Onset  . Stroke Father     . Cancer Mother        Brain  . Heart attack Brother    Social History   Socioeconomic History  . Marital status: Single    Spouse name: Not on file  . Number of children: Not on file  . Years of education: Not on file  . Highest education level: Not on file  Social Needs  . Financial resource strain: Not on file  . Food insecurity - worry: Not on file  . Food insecurity - inability: Not on file  . Transportation needs - medical: Not on file  . Transportation needs - non-medical: Not on file  Occupational History  . Not on file  Tobacco Use  . Smoking status: Former Smoker    Last attempt to quit: 08/29/1971    Years since quitting: 46.0  . Smokeless tobacco: Never Used  Substance and Sexual Activity  . Alcohol use: No  . Drug use: No  . Sexual activity: Not on file  Other Topics Concern  . Not on file  Social History Narrative  . Not on file   Tobacco Counseling Counseling given: Not Answered   Clinical Intake:    Activities of Daily Living No flowsheet data found.   Immunizations and Health Maintenance Immunization History  Administered Date(s) Administered  . Influenza-Unspecified  05/17/2014  . Td 11/15/2007   Health Maintenance Due  Topic Date Due  . PNA vac Low Risk Adult (1 of 2 - PCV13) 03/29/2014  . INFLUENZA VACCINE  03/28/2017    Patient Care Team: Martinique, Betty G, MD as PCP - General (Family Medicine) Ladene Artist, MD as Consulting Physician (Gastroenterology) Agapito Games (Optometry) Jarome Matin, MD as Consulting Physician (Dermatology)  Indicate any recent Medical Services you may have received from other than [facility] providers in the past year (date may be approximate).    Assessment:   This is a routine wellness examination for [name].  Hearing/Vision screen No exam data present  Dietary issues and exercise activities discussed:    Goals    None     Depression Screen PHQ 2/9 Scores 08/16/2016 03/05/2015  PHQ - 2 Score  0 0    Fall Risk Fall Risk  08/16/2016 03/05/2015  Falls in the past year? No No      Cognitive Function:        Screening Tests Health Maintenance  Topic Date Due  . PNA vac Low Risk Adult (1 of 2 - PCV13) 03/29/2014  . INFLUENZA VACCINE  03/28/2017  . TETANUS/TDAP  11/14/2017  . COLON CANCER SCREENING ANNUAL FOBT  01/28/2018  . Hepatitis C Screening  Completed          Plan:   ***  I have personally reviewed and noted the following in the patient's chart:   . Medical and social history . Use of alcohol, tobacco or illicit drugs  . Current medications and supplements . Functional ability and status . Nutritional status . Physical activity . Advanced directives . List of other physicians . Hospitalizations, surgeries, and ER visits in previous 12 months . Vitals . Screenings to include cognitive, depression, and falls . Referrals and appointments  In addition, I have reviewed and discussed with patient certain preventive protocols, quality metrics, and best practice recommendations. A written personalized care plan for preventive services as well as general preventive health recommendations were provided to patient.     Wynetta Fines, RN   09/11/2017

## 2017-09-19 ENCOUNTER — Ambulatory Visit: Payer: Medicare HMO | Admitting: Family Medicine

## 2017-09-19 ENCOUNTER — Ambulatory Visit: Payer: Medicare HMO

## 2017-09-27 ENCOUNTER — Ambulatory Visit (INDEPENDENT_AMBULATORY_CARE_PROVIDER_SITE_OTHER): Payer: Medicare HMO | Admitting: Family Medicine

## 2017-09-27 ENCOUNTER — Encounter: Payer: Self-pay | Admitting: Family Medicine

## 2017-09-27 VITALS — BP 142/96 | HR 76 | Temp 98.1°F | Ht 69.5 in | Wt 173.0 lb

## 2017-09-27 DIAGNOSIS — B309 Viral conjunctivitis, unspecified: Secondary | ICD-10-CM

## 2017-09-27 NOTE — Patient Instructions (Signed)
Please let us know if your symptoms do not  Improve.    Viral Conjunctivitis, Adult Viral conjunctivitis is an inflammation of the clear membrane that covers the white part of your eye and the inner surface of your eyelid (conjunctiva). The inflammation is caused by a viral infection. The blood vessels in the conjunctiva become inflamed, causing the eye to become red or pink, and often itchy. Viral conjunctivitis can be easily passed from one person to another (is contagious). This condition is often called pink eye. What are the causes? This condition is caused by a virus. A virus is a type of contagious germ. It can be spread by touching objects that have been contaminated with the virus, such as doorknobs or towels. It can also be passed through droplets, such as from coughing or sneezing. What are the signs or symptoms? Symptoms of this condition include:  Eye redness.  Tearing or watery eyes.  Itchy and irritated eyes.  Burning feeling in the eyes.  Clear drainage from the eye.  Swollen eyelids.  A gritty feeling in the eye.  Light sensitivity.  This condition often occurs with other symptoms, such as a fever, nausea, or a rash. How is this diagnosed? This condition is diagnosed with a medical history and physical exam. If you have discharge from your eye, the discharge may be tested to rule out other causes of conjunctivitis. How is this treated? Viral conjunctivitis does not respond to medicines that kill bacteria (antibiotics). Treatment for viral conjunctivitis is directed at stopping a bacterial infection from developing in addition to the viral infection. Treatment also aims to relieve your symptoms, such as itching. This may be done with antihistamine drops or other eye medicines. Rarely, steroid eye drops or antiviral medicines may be prescribed. Follow these instructions at home: Medicines   Take or apply over-the-counter and prescription medicines only as told by  your health care provider.  Be very careful to avoid touching the edge of the eyelid with the eye drop bottle or ointment tube when applying medicines to the affected eye. Being careful this way will stop you from spreading the infection to the other eye or to other people. Eye care  Avoid touching or rubbing your eyes.  Apply a warm, wet, clean washcloth to your eye for 10-20 minutes, 3-4 times per day or as told by your health care provider.  If you wear contact lenses, do not wear them until the inflammation is gone and your health care provider says it is safe to wear them again. Ask your health care provider how to sterilize or replace your contact lenses before using them again. Wear glasses until you can resume wearing contacts.  Avoid wearing eye makeup until the inflammation is gone. Throw away any old eye cosmetics that may be contaminated.  Gently wipe away any drainage from your eye with a warm, wet washcloth or a cotton ball. General instructions  Change or wash your pillowcase every day or as told by your health care provider.  Do not share towels, pillowcases, washcloths, eye makeup, makeup brushes, contact lenses, or glasses. This may spread the infection.  Wash your hands often with soap and water. Use paper towels to dry your hands. If soap and water are not available, use hand sanitizer.  Try to avoid contact with other people for one week or as told by your health care provider. Contact a health care provider if:  Your symptoms do not improve with treatment or they get worse.  You have increased pain.  Your vision becomes blurry.  You have a fever.  You have facial pain, redness, or swelling.  You have yellow or green drainage coming from your eye.  You have new symptoms. This information is not intended to replace advice given to you by your health care provider. Make sure you discuss any questions you have with your health care provider. Document Released:  11/04/2002 Document Revised: 03/11/2016 Document Reviewed: 02/29/2016 Elsevier Interactive Patient Education  [employer].

## 2017-09-27 NOTE — Progress Notes (Signed)
[name] - 67 y.o. male MRN [id number]  Date of birth: 03-10-1951  SUBJECTIVE:  Including CC & ROS.  Chief Complaint  Patient presents with  . Conjunctivitis    [name] is a 67 y.o. male that is presenting with pink eye. He noticed some redness in his eyes yesterday. He had a cold last week. Denies fever or body aches. He woke up this morning with discharge and his vision is kind of blurry. He has been applying a warm cloth to his eyes.    Review of Systems  Constitutional: Negative for fever.  HENT: Positive for sore throat.   Eyes: Positive for redness. Negative for photophobia.  Respiratory: Negative for cough.   Cardiovascular: Negative for chest pain.  Gastrointestinal: Negative for abdominal pain.    HISTORY: Past Medical, Surgical, Social, and Family History Reviewed & Updated per EMR.   Pertinent Historical Findings include:  Past Medical History:  Diagnosis Date  . Diverticulosis   . Hyperlipidemia   . Hypertension   . Internal hemorrhoid   . Vitamin D deficiency     Past Surgical History:  Procedure Laterality Date  . INGUINAL HERNIA REPAIR    . NASAL SEPTUM SURGERY    . TONSILLECTOMY      Allergies  Allergen Reactions  . Penicillins     Family History  Problem Relation Age of Onset  . Stroke Father   . Cancer Mother        Brain  . Heart attack Brother      Social History   Socioeconomic History  . Marital status: Single    Spouse name: Not on file  . Number of children: Not on file  . Years of education: Not on file  . Highest education level: Not on file  Social Needs  . Financial resource strain: Not on file  . Food insecurity - worry: Not on file  . Food insecurity - inability: Not on file  . Transportation needs - medical: Not on file  . Transportation needs - non-medical: Not on file  Occupational History  . Not on file  Tobacco Use  . Smoking status: Former Smoker    Last attempt to quit: 08/29/1971    Years  since quitting: 46.1  . Smokeless tobacco: Never Used  Substance and Sexual Activity  . Alcohol use: No  . Drug use: No  . Sexual activity: Not on file  Other Topics Concern  . Not on file  Social History Narrative  . Not on file     PHYSICAL EXAM:  VS: BP (!) 142/96 (BP Location: Left Arm, Patient Position: Sitting, Cuff Size: Normal)   Pulse 76   Temp 98.1 F (36.7 C) (Oral)   Ht 5' 9.5" (1.765 m)   Wt 173 lb (78.5 kg)   SpO2 98%   BMI 25.18 kg/m  Physical Exam Gen: NAD, alert, cooperative with exam,  ENT: normal lips, normal nasal mucosa, tympanic membranes clear and intact bilaterally, normal oropharynx, no cervical lymphadenopathy Eye: normal EOM, normal lids, injected conjunctiva b/l  CV:  no edema, +2 pedal pulses, regular rate and rhythm, S1-S2   Resp: no accessory muscle use, non-labored, clear to auscultation bilaterally, no crackles or wheezes Skin: no rashes, no areas of induration  Neuro: normal tone, normal sensation to touch Psych:  normal insight, alert and oriented MSK: Normal gait, normal strength       ASSESSMENT & PLAN:   Viral conjunctivitis Likely viral in nature. Has had  a cold recently. Wears glasses and no contacts.  - counseled on supportive care  - advised if symptoms are not improving by Monday then let us know or schedule an appointment with his ophthalmologist

## 2017-09-27 NOTE — Assessment & Plan Note (Signed)
Likely viral in nature. Has had a cold recently. Wears glasses and no contacts.  - counseled on supportive care  - advised if symptoms are not improving by Monday then let us know or schedule an appointment with his ophthalmologist

## 2017-10-18 ENCOUNTER — Ambulatory Visit: Payer: Self-pay

## 2017-10-18 NOTE — Telephone Encounter (Signed)
Pt calling with dizziness and feels like a "loss of equilibrium." Denies any spinning or tilting room. Moderate in severity. Standing and walking make it worse. Pt thinks it is an inner ear disturbance. Pt wanting and appt. Made for tomorrow with Dr. Elease Hashimoto. Care advice given.  Reason for Disposition . [1] MODERATE dizziness (e.g., interferes with normal activities) AND [2] has NOT been evaluated by physician for this  (Exception: dizziness caused by heat exposure, sudden standing, or poor fluid intake)  Answer Assessment - Initial Assessment Questions 1. DESCRIPTION: "Describe your dizziness."     Cannot find find my equilibrium  2. LIGHTHEADED: "Do you feel lightheaded?" (e.g., somewhat faint, woozy, weak upon standing)     Dizziness  3. VERTIGO: "Do you feel like either you or the room is spinning or tilting?" (i.e. vertigo)     no 4. SEVERITY: "How bad is it?"  "Do you feel like you are going to faint?" "Can you stand and walk?"   - MILD - walking normally   - MODERATE - interferes with normal activities (e.g., work, school)    - SEVERE - unable to stand, requires support to walk, feels like passing out now.      moderate 5. ONSET:  "When did the dizziness begin?"     yesterday 6. AGGRAVATING FACTORS: "Does anything make it worse?" (e.g., standing, change in head position)     Standing, walking 7. HEART RATE: "Can you tell me your heart rate?" "How many beats in 15 seconds?"  (Note: not all patients can do this)       Pt cannot perform 8. CAUSE: "What do you think is causing the dizziness?"     Inner ear disturbance 9. RECURRENT SYMPTOM: "Have you had dizziness before?" If so, ask: "When was the last time?" "What happened that time?"     no 10. OTHER SYMPTOMS: "Do you have any other symptoms?" (e.g., fever, chest pain, vomiting, diarrhea, bleeding)       No  11. PREGNANCY: "Is there any chance you are pregnant?" "When was your last menstrual period?"       n/a  Protocols used:  DIZZINESS [facility]

## 2017-10-19 ENCOUNTER — Ambulatory Visit (INDEPENDENT_AMBULATORY_CARE_PROVIDER_SITE_OTHER): Payer: Medicare HMO | Admitting: Family Medicine

## 2017-10-19 ENCOUNTER — Encounter: Payer: Self-pay | Admitting: Family Medicine

## 2017-10-19 VITALS — BP 140/90 | HR 60 | Temp 98.5°F | Wt 162.7 lb

## 2017-10-19 DIAGNOSIS — H6123 Impacted cerumen, bilateral: Secondary | ICD-10-CM | POA: Diagnosis not present

## 2017-10-19 DIAGNOSIS — R42 Dizziness and giddiness: Secondary | ICD-10-CM

## 2017-10-19 NOTE — Progress Notes (Signed)
Subjective:     Patient ID: [name], male   DOB: 1950-11-29, 67 y.o.   MRN: [id number]  HPI Patient seen with complaints of bilateral ear "fullness" and some dizziness past couple days. He denies any nasal congestion. He's had problems with cerumen build up in the past. His dizziness he describes as vertigo and comes and goes. Worse with movement. No headaches. No fever. He had one episode of mild nausea but no vomiting. Is not certain whether there is any clear directional component as trigger for his dizziness. No hearing changes. No speech changes. No vision changes. No focal weakness. No ataxia.  Patient states he feels "better" today  Past Medical History:  Diagnosis Date  . Diverticulosis   . Hyperlipidemia   . Hypertension   . Internal hemorrhoid   . Vitamin D deficiency    Past Surgical History:  Procedure Laterality Date  . INGUINAL HERNIA REPAIR    . NASAL SEPTUM SURGERY    . TONSILLECTOMY      reports that he quit smoking about 46 years ago. he has never used smokeless tobacco. He reports that he does not drink alcohol or use drugs. family history includes Cancer in his mother; Heart attack in his brother; Stroke in his father. Allergies  Allergen Reactions  . Penicillins      Review of Systems  Constitutional: Negative for chills and fever.  HENT: Negative for ear pain and sore throat.   Eyes: Negative for visual disturbance.  Respiratory: Negative for shortness of breath.   Cardiovascular: Negative for chest pain.  Neurological: Positive for dizziness. Negative for seizures, syncope, speech difficulty, weakness and headaches.  Psychiatric/Behavioral: Negative for confusion.       Objective:   Physical Exam  Constitutional: He is oriented to person, place, and time. He appears well-developed and well-nourished.  HENT:  Head: Normocephalic and atraumatic.  Mouth/Throat: Oropharynx is clear and moist.  Patient has bilateral cerumen impactions. Eardrums  not visualized  Eyes: EOM are normal. Pupils are equal, round, and reactive to light.  Neck: Neck supple.  Cardiovascular: Normal rate and regular rhythm.  Pulmonary/Chest: Effort normal and breath sounds normal. No respiratory distress. He has no wheezes. He has no rales.  Musculoskeletal: He exhibits no edema.  Neurological: He is alert and oriented to person, place, and time. No cranial nerve deficit. Coordination normal.  No focal strength deficits. Gait normal. Normal cerebellar function by finger to nose testing. Vertigo not reproduced with Dix-Hallpike maneuver Mild/subtle horizontal nystagmus.         Assessment:     #1 bilateral cerumen impactions  #2 transient mild vertigo. Not reproducible on exam and nonfocal neuro exam    Plan:     -Offered irrigation for his cerumen. Patient first wants to try home wax removal with candle which he has used with success in the past -Handout on vertigo given. Reviewed signs and symptoms of more worrisome vertigo. Those symptoms seem to be improving.  Eulas Post MD [facility]

## 2017-10-19 NOTE — Patient Instructions (Signed)
Vertigo °Vertigo is the feeling that you or your surroundings are moving when they are not. Vertigo can be dangerous if it occurs while you are doing something that could endanger you or others, such as driving. °What are the causes? °This condition is caused by a disturbance in the signals that are sent by your body’s sensory systems to your brain. Different causes of a disturbance can lead to vertigo, including: °· Infections, especially in the inner ear. °· A bad reaction to a drug, or misuse of alcohol and medicines. °· Withdrawal from drugs or alcohol. °· Quickly changing positions, as when lying down or rolling over in bed. °· Migraine headaches. °· Decreased blood flow to the brain. °· Decreased blood pressure. °· Increased pressure in the brain from a head or neck injury, stroke, infection, tumor, or bleeding. °· Central nervous system disorders. ° °What are the signs or symptoms? °Symptoms of this condition usually occur when you move your head or your eyes in different directions. Symptoms may start suddenly, and they usually last for less than a minute. Symptoms may include: °· Loss of balance and falling. °· Feeling like you are spinning or moving. °· Feeling like your surroundings are spinning or moving. °· Nausea and vomiting. °· Blurred vision or double vision. °· Difficulty hearing. °· Slurred speech. °· Dizziness. °· Involuntary eye movement (nystagmus). ° °Symptoms can be mild and cause only slight annoyance, or they can be severe and interfere with daily life. Episodes of vertigo may return (recur) over time, and they are often triggered by certain movements. Symptoms may improve over time. °How is this diagnosed? °This condition may be diagnosed based on medical history and the quality of your nystagmus. Your health care provider may test your eye movements by asking you to quickly change positions to trigger the nystagmus. This may be called the Dix-Hallpike test, head thrust test, or roll test.  You may be referred to a health care provider who specializes in ear, nose, and throat (ENT) problems (otolaryngologist) or a provider who specializes in disorders of the central nervous system (neurologist). °You may have additional testing, including: °· A physical exam. °· Blood tests. °· MRI. °· A CT scan. °· An electrocardiogram (ECG). This records electrical activity in your heart. °· An electroencephalogram (EEG). This records electrical activity in your brain. °· Hearing tests. ° °How is this treated? °Treatment for this condition depends on the cause and the severity of the symptoms. Treatment options include: °· Medicines to treat nausea or vertigo. These are usually used for severe cases. Some medicines that are used to treat other conditions may also reduce or eliminate vertigo symptoms. These include: °? Medicines that control allergies (antihistamines). °? Medicines that control seizures (anticonvulsants). °? Medicines that relieve depression (antidepressants). °? Medicines that relieve anxiety (sedatives). °· Head movements to adjust your inner ear back to normal. If your vertigo is caused by an ear problem, your health care provider may recommend certain movements to correct the problem. °· Surgery. This is rare. ° °Follow these instructions at home: °Safety °· Move slowly.Avoid sudden body or head movements. °· Avoid driving. °· Avoid operating heavy machinery. °· Avoid doing any tasks that would cause danger to you or others if you would have a vertigo episode during the task. °· If you have trouble walking or keeping your balance, try using a cane for stability. If you feel dizzy or unstable, sit down right away. °· Return to your normal activities as told by your   health care provider. Ask your health care provider what activities are safe for you. °General instructions °· Take over-the-counter and prescription medicines only as told by your health care provider. °· Avoid certain positions or  movements as told by your health care provider. °· Drink enough fluid to keep your urine clear or pale yellow. °· Keep all follow-up visits as told by your health care provider. This is important. °Contact a health care provider if: °· Your medicines do not relieve your vertigo or they make it worse. °· You have a fever. °· Your condition gets worse or you develop new symptoms. °· Your family or friends notice any behavioral changes. °· Your nausea or vomiting gets worse. °· You have numbness or a “pins and needles” sensation in part of your body. °Get help right away if: °· You have difficulty moving or speaking. °· You are always dizzy. °· You faint. °· You develop severe headaches. °· You have weakness in your hands, arms, or legs. °· You have changes in your hearing or vision. °· You develop a stiff neck. °· You develop sensitivity to light. °This information is not intended to replace advice given to you by your health care provider. Make sure you discuss any questions you have with your health care provider. °Document Released: 05/24/2005 Document Revised: 01/26/2016 Document Reviewed: 12/07/2014 °Elsevier Interactive Patient Education © 2018 [employer] ° °

## 2017-11-02 ENCOUNTER — Ambulatory Visit (INDEPENDENT_AMBULATORY_CARE_PROVIDER_SITE_OTHER): Payer: Medicare HMO

## 2017-11-02 VITALS — BP 150/90 | HR 75 | Ht 69.0 in | Wt 171.5 lb

## 2017-11-02 DIAGNOSIS — Z0000 Encounter for general adult medical examination without abnormal findings: Secondary | ICD-10-CM

## 2017-11-02 DIAGNOSIS — Z87891 Personal history of nicotine dependence: Secondary | ICD-10-CM | POA: Diagnosis not present

## 2017-11-02 NOTE — Patient Instructions (Addendum)
[name] , Thank you for taking time to come for your Medicare Wellness Visit. I appreciate your ongoing commitment to your health goals. Please review the following plan we discussed and let me know if I can assist you in the future.   Check your BP periodically, drink plenty of fluids  We would like for your BP when out; let the doctor  Know if it runs higher than 140/80  I have printed information on the DASH diet   The Centers for Disease Control are now recommending 2 pneumonia vaccinations after 75. The first is the Prevnar 13. This helps to boost your immunity to community acquired pneumonia as well as some protection from bacterial pneumonia  The 2nd is the pneumovax 23, which offers more broad protection!  Please consider taking these as this is your best protection against pneumonia. You can go to the [facility].gov  Will fup with you colonoscopy; will call [facility] to [place] will order the colo-guard  Will send to [address] [place], [place] can have a hearing screen anywhere you want to go  Have Dr. Martinique check your ears to be sure they are free of wax     These are the goals we discussed: Goals    . Weight (lb) < 160 lb (72.6 kg)     Start playing golf more  Slow down on sodium Start eating a healthier diet  Lean and green; vegetables and fruits  Check out  online nutrition programs as GumSearch.nl and http://vang.com/; fit12me; Look for foods with "whole" wheat; bran; oatmeal etc Shot at the farmer's markets in season for fresher choices  Watch for "hydrogenated" on the label of oils which are trans-fats.  Watch for "high fructose corn syrup" in snacks, yogurt or ketchup  Meats have less marbling; bright colored fruits and vegetables;  Canned; dump out liquid and wash vegetables. Be mindful of what we are eating  Portion control is essential to a health weight! Sit down; take a break and enjoy your meal; take smaller bites; put the  fork down between bites;  It takes 20 minutes to get full; so check in with your fullness cues and stop eating when you start to fill full              This is a list of the screening recommended for you and due dates:  Health Maintenance  Topic Date Due  . Pneumonia vaccines (1 of 2 - PCV13) 03/29/2014  . Colon Cancer Screening  11/27/2016  . Tetanus Vaccine  11/14/2017  . Stool Blood Test  01/28/2018  . Flu Shot  Completed  .  Hepatitis C: One time screening is recommended by Center for Disease Control  (CDC) for  adults born from [birth year] through 1967.   Completed    DASH Eating Plan DASH stands for "Dietary Approaches to Stop Hypertension." The DASH eating plan is a healthy eating plan that has been shown to reduce high blood pressure (hypertension). It may also reduce your risk for type 2 diabetes, heart disease, and stroke. The DASH eating plan may also help with weight loss. What are tips for following this plan? General guidelines  Avoid eating more than 2,300 mg (milligrams) of salt (sodium) a day. If you have hypertension, you may need to reduce your sodium intake to 1,500 mg a day.  Limit alcohol intake to no more than 1 drink a day for nonpregnant women and 2  drinks a day for men. One drink equals 12 oz of beer, 5 oz of wine, or 1 oz of hard liquor.  Work with your health care provider to maintain a healthy body weight or to lose weight. Ask what an ideal weight is for you.  Get at least 30 minutes of exercise that causes your heart to beat faster (aerobic exercise) most days of the week. Activities may include walking, swimming, or biking.  Work with your health care provider or diet and nutrition specialist (dietitian) to adjust your eating plan to your individual calorie needs. Reading food labels  Check food labels for the amount of sodium per serving. Choose foods with less than 5 percent of the Daily Value of sodium. Generally, foods with less than 300 mg of  sodium per serving fit into this eating plan.  To find whole grains, look for the word "whole" as the first word in the ingredient list. Shopping  Buy products labeled as "low-sodium" or "no salt added."  Buy fresh foods. Avoid canned foods and premade or frozen meals. Cooking  Avoid adding salt when cooking. Use salt-free seasonings or herbs instead of table salt or sea salt. Check with your health care provider or pharmacist before using salt substitutes.  Do not fry foods. Cook foods using healthy methods such as baking, boiling, grilling, and broiling instead.  Cook with heart-healthy oils, such as olive, canola, soybean, or sunflower oil. Meal planning   Eat a balanced diet that includes: ? 5 or more servings of fruits and vegetables each day. At each meal, try to fill half of your plate with fruits and vegetables. ? Up to 6-8 servings of whole grains each day. ? Less than 6 oz of lean meat, poultry, or fish each day. A 3-oz serving of meat is about the same size as a deck of cards. One egg equals 1 oz. ? 2 servings of low-fat dairy each day. ? A serving of nuts, seeds, or beans 5 times each week. ? Heart-healthy fats. Healthy fats called Omega-3 fatty acids are found in foods such as flaxseeds and coldwater fish, like sardines, salmon, and mackerel.  Limit how much you eat of the following: ? Canned or prepackaged foods. ? Food that is high in trans fat, such as fried foods. ? Food that is high in saturated fat, such as fatty meat. ? Sweets, desserts, sugary drinks, and other foods with added sugar. ? Full-fat dairy products.  Do not salt foods before eating.  Try to eat at least 2 vegetarian meals each week.  Eat more home-cooked food and less restaurant, buffet, and fast food.  When eating at a restaurant, ask that your food be prepared with less salt or no salt, if possible. What foods are recommended? The items listed may not be a complete list. Talk with your  dietitian about what dietary choices are best for you. Grains Whole-grain or whole-wheat bread. Whole-grain or whole-wheat pasta. Brown rice. [place]. Bulgur. Whole-grain and low-sodium cereals. Pita bread. Low-fat, low-sodium crackers. Whole-wheat flour tortillas. Vegetables Fresh or frozen vegetables (raw, steamed, roasted, or grilled). Low-sodium or reduced-sodium tomato and vegetable juice. Low-sodium or reduced-sodium tomato sauce and tomato paste. Low-sodium or reduced-sodium canned vegetables. Fruits All fresh, dried, or frozen fruit. Canned fruit in natural juice (without added sugar). Meat and other protein foods Skinless chicken or [place]. Ground chicken or [place]. Pork with fat trimmed off. Fish and seafood. Egg whites. Dried beans, peas, or lentils. Unsalted nuts, nut butters,  and seeds. Unsalted canned beans. Lean cuts of beef with fat trimmed off. Low-sodium, lean deli meat. Dairy Low-fat (1%) or fat-free (skim) milk. Fat-free, low-fat, or reduced-fat cheeses. Nonfat, low-sodium ricotta or cottage cheese. Low-fat or nonfat yogurt. Low-fat, low-sodium cheese. Fats and oils Soft margarine without trans fats. Vegetable oil. Low-fat, reduced-fat, or light mayonnaise and salad dressings (reduced-sodium). Canola, safflower, olive, soybean, and sunflower oils. Avocado. Seasoning and other foods Herbs. Spices. Seasoning mixes without salt. Unsalted popcorn and pretzels. Fat-free sweets. What foods are not recommended? The items listed may not be a complete list. Talk with your dietitian about what dietary choices are best for you. Grains Baked goods made with fat, such as croissants, muffins, or some breads. Dry pasta or rice meal packs. Vegetables Creamed or fried vegetables. Vegetables in a cheese sauce. Regular canned vegetables (not low-sodium or reduced-sodium). Regular canned tomato sauce and paste (not low-sodium or reduced-sodium). Regular tomato and vegetable juice (not  low-sodium or reduced-sodium). Angie Fava. Olives. Fruits Canned fruit in a light or heavy syrup. Fried fruit. Fruit in cream or butter sauce. Meat and other protein foods Fatty cuts of meat. Ribs. Fried meat. Berniece Salines. Sausage. Bologna and other processed lunch meats. Salami. Fatback. Hotdogs. Bratwurst. Salted nuts and seeds. Canned beans with added salt. Canned or smoked fish. Whole eggs or egg yolks. Chicken or [place] with skin. Dairy Whole or 2% milk, cream, and half-and-half. Whole or full-fat cream cheese. Whole-fat or sweetened yogurt. Full-fat cheese. Nondairy creamers. Whipped toppings. Processed cheese and cheese spreads. Fats and oils Butter. Stick margarine. Lard. Shortening. Ghee. Bacon fat. Tropical oils, such as coconut, palm kernel, or palm oil. Seasoning and other foods Salted popcorn and pretzels. Onion salt, garlic salt, seasoned salt, table salt, and sea salt. Worcestershire sauce. Tartar sauce. Barbecue sauce. Teriyaki sauce. Soy sauce, including reduced-sodium. Steak sauce. Canned and packaged gravies. Fish sauce. Oyster sauce. Cocktail sauce. Horseradish that you find on the shelf. Ketchup. Mustard. Meat flavorings and tenderizers. Bouillon cubes. Hot sauce and Tabasco sauce. Premade or packaged marinades. Premade or packaged taco seasonings. Relishes. Regular salad dressings. Where to find more information:  National Heart, Lung, and [place]: https://wilson-eaton.com/  American Heart Association: www.heart.org Summary  The DASH eating plan is a healthy eating plan that has been shown to reduce high blood pressure (hypertension). It may also reduce your risk for type 2 diabetes, heart disease, and stroke.  With the DASH eating plan, you should limit salt (sodium) intake to 2,300 mg a day. If you have hypertension, you may need to reduce your sodium intake to 1,500 mg a day.  When on the DASH eating plan, aim to eat more fresh fruits and vegetables, whole grains, lean proteins,  low-fat dairy, and heart-healthy fats.  Work with your health care provider or diet and nutrition specialist (dietitian) to adjust your eating plan to your individual calorie needs. This information is not intended to replace advice given to you by your health care provider. Make sure you discuss any questions you have with your health care provider. Document Released: 08/03/2011 Document Revised: 08/07/2016 Document Reviewed: 08/07/2016 Elsevier Interactive Patient Education  2018 [place] in the Home Falls can cause injuries. They can happen to people of all ages. There are many things you can do to make your home safe and to help prevent falls. What can I do on the outside of my home?  Regularly fix the edges of walkways and driveways and fix any cracks.  Remove anything that might make you trip as you walk through a door, such as a raised step or threshold.  Trim any bushes or trees on the path to your home.  Use bright outdoor lighting.  Clear any walking paths of anything that might make someone trip, such as rocks or tools.  Regularly check to see if handrails are loose or broken. Make sure that both sides of any steps have handrails.  Any raised decks and porches should have guardrails on the edges.  Have any leaves, snow, or ice cleared regularly.  Use sand or salt on walking paths during winter.  Clean up any spills in your garage right away. This includes oil or grease spills. What can I do in the bathroom?  Use night lights.  Install grab bars by the toilet and in the tub and shower. Do not use towel bars as grab bars.  Use non-skid mats or decals in the tub or shower.  If you need to sit down in the shower, use a plastic, non-slip stool.  Keep the floor dry. Clean up any water that spills on the floor as soon as it happens.  Remove soap buildup in the tub or shower regularly.  Attach bath mats securely with double-sided non-slip rug  tape.  Do not have throw rugs and other things on the floor that can make you trip. What can I do in the bedroom?  Use night lights.  Make sure that you have a light by your bed that is easy to reach.  Do not use any sheets or blankets that are too big for your bed. They should not hang down onto the floor.  Have a firm chair that has side arms. You can use this for support while you get dressed.  Do not have throw rugs and other things on the floor that can make you trip. What can I do in the kitchen?  Clean up any spills right away.  Avoid walking on wet floors.  Keep items that you use a lot in easy-to-reach places.  If you need to reach something above you, use a strong step stool that has a grab bar.  Keep electrical cords out of the way.  Do not use floor polish or wax that makes floors slippery. If you must use wax, use non-skid floor wax.  Do not have throw rugs and other things on the floor that can make you trip. What can I do with my stairs?  Do not leave any items on the stairs.  Make sure that there are handrails on both sides of the stairs and use them. Fix handrails that are broken or loose. Make sure that handrails are as long as the stairways.  Check any carpeting to make sure that it is firmly attached to the stairs. Fix any carpet that is loose or worn.  Avoid having throw rugs at the top or bottom of the stairs. If you do have throw rugs, attach them to the floor with carpet tape.  Make sure that you have a light switch at the top of the stairs and the bottom of the stairs. If you do not have them, ask someone to add them for you. What else can I do to help prevent falls?  Wear shoes that: ? Do not have high heels. ? Have rubber bottoms. ? Are comfortable and fit you well. ? Are closed at the toe. Do not wear sandals.  If you use a stepladder: ? Make  sure that it is fully opened. Do not climb a closed stepladder. ? Make sure that both sides of the  stepladder are locked into place. ? Ask someone to hold it for you, if possible.  Clearly mark and make sure that you can see: ? Any grab bars or handrails. ? First and last steps. ? Where the edge of each step is.  Use tools that help you move around (mobility aids) if they are needed. These include: ? Canes. ? Walkers. ? Scooters. ? Crutches.  Turn on the lights when you go into a dark area. Replace any light bulbs as soon as they burn out.  Set up your furniture so you have a clear path. Avoid moving your furniture around.  If any of your floors are uneven, fix them.  If there are any pets around you, be aware of where they are.  Review your medicines with your doctor. Some medicines can make you feel dizzy. This can increase your chance of falling. Ask your doctor what other things that you can do to help prevent falls. This information is not intended to replace advice given to you by your health care provider. Make sure you discuss any questions you have with your health care provider. Document Released: 06/10/2009 Document Revised: 01/20/2016 Document Reviewed: 09/18/2014 Elsevier Interactive Patient Education  2018 [place] Maintenance, Male A healthy lifestyle and preventive care is important for your health and wellness. Ask your health care provider about what schedule of regular examinations is right for you. What should I know about weight and diet? Eat a Healthy Diet  Eat plenty of vegetables, fruits, whole grains, low-fat dairy products, and lean protein.  Do not eat a lot of foods high in solid fats, added sugars, or salt.  Maintain a Healthy Weight Regular exercise can help you achieve or maintain a healthy weight. You should:  Do at least 150 minutes of exercise each week. The exercise should increase your heart rate and make you sweat (moderate-intensity exercise).  Do strength-training exercises at least twice a week.  Watch Your Levels  of Cholesterol and Blood Lipids  Have your blood tested for lipids and cholesterol every 5 years starting at 69 years of age. If you are at high risk for heart disease, you should start having your blood tested when you are 69 years old. You may need to have your cholesterol levels checked more often if: ? Your lipid or cholesterol levels are high. ? You are older than 69 years of age. ? You are at high risk for heart disease.  What should I know about cancer screening? Many types of cancers can be detected early and may often be prevented. Lung Cancer  You should be screened every year for lung cancer if: ? You are a current smoker who has smoked for at least 30 years. ? You are a former smoker who has quit within the past 15 years.  Talk to your health care provider about your screening options, when you should start screening, and how often you should be screened.  Colorectal Cancer  Routine colorectal cancer screening usually begins at 69 years of age and should be repeated every 5-10 years until you are 69 years old. You may need to be screened more often if early forms of precancerous polyps or small growths are found. Your health care provider may recommend screening at an earlier age if you have risk factors for colon cancer.  Your health care  provider may recommend using home test kits to check for hidden blood in the stool.  A small camera at the end of a tube can be used to examine your colon (sigmoidoscopy or colonoscopy). This checks for the earliest forms of colorectal cancer.  Prostate and Testicular Cancer  Depending on your age and overall health, your health care provider may do certain tests to screen for prostate and testicular cancer.  Talk to your health care provider about any symptoms or concerns you have about testicular or prostate cancer.  Skin Cancer  Check your skin from head to toe regularly.  Tell your health care provider about any new moles or  changes in moles, especially if: ? There is a change in a mole's size, shape, or color. ? You have a mole that is larger than a pencil eraser.  Always use sunscreen. Apply sunscreen liberally and repeat throughout the day.  Protect yourself by wearing long sleeves, pants, a wide-brimmed hat, and sunglasses when outside.  What should I know about heart disease, diabetes, and high blood pressure?  If you are 21-42 years of age, have your blood pressure checked every 3-5 years. If you are 71 years of age or older, have your blood pressure checked every year. You should have your blood pressure measured twice-once when you are at a hospital or clinic, and once when you are not at a hospital or clinic. Record the average of the two measurements. To check your blood pressure when you are not at a hospital or clinic, you can use: ? An automated blood pressure machine at a pharmacy. ? A home blood pressure monitor.  Talk to your health care provider about your target blood pressure.  If you are between 32-78 years old, ask your health care provider if you should take aspirin to prevent heart disease.  Have regular diabetes screenings by checking your fasting blood sugar level. ? If you are at a normal weight and have a low risk for diabetes, have this test once every three years after the age of 67. ? If you are overweight and have a high risk for diabetes, consider being tested at a younger age or more often.  A one-time screening for abdominal aortic aneurysm (AAA) by ultrasound is recommended for men aged 43-75 years who are current or former smokers. What should I know about preventing infection? Hepatitis B If you have a higher risk for hepatitis B, you should be screened for this virus. Talk with your health care provider to find out if you are at risk for hepatitis B infection. Hepatitis C Blood testing is recommended for:  Everyone born from [birth year] through 1967.  Anyone with known risk  factors for hepatitis C.  Sexually Transmitted Diseases (STDs)  You should be screened each year for STDs including gonorrhea and chlamydia if: ? You are sexually active and are younger than 69 years of age. ? You are older than 69 years of age and your health care provider tells you that you are at risk for this type of infection. ? Your sexual activity has changed since you were last screened and you are at an increased risk for chlamydia or gonorrhea. Ask your health care provider if you are at risk.  Talk with your health care provider about whether you are at high risk of being infected with HIV. Your health care provider may recommend a prescription medicine to help prevent HIV infection.  What else can I do?  Schedule regular  health, dental, and eye exams.  Stay current with your vaccines (immunizations).  Do not use any tobacco products, such as cigarettes, chewing tobacco, and e-cigarettes. If you need help quitting, ask your health care provider.  Limit alcohol intake to no more than 2 drinks per day. One drink equals 12 ounces of beer, 5 ounces of wine, or 1 ounces of hard liquor.  Do not use street drugs.  Do not share needles.  Ask your health care provider for help if you need support or information about quitting drugs.  Tell your health care provider if you often feel depressed.  Tell your health care provider if you have ever been abused or do not feel safe at home. This information is not intended to replace advice given to you by your health care provider. Make sure you discuss any questions you have with your health care provider. Document Released: 02/10/2008 Document Revised: 04/12/2016 Document Reviewed: 05/18/2015 Elsevier Interactive Patient Education  [employer].

## 2017-11-02 NOTE — Progress Notes (Signed)
Subjective:   [name] is a 67 y.o. male who presents for an Initial Medicare Annual Wellness Visit.  Reports health as Last OV 07/2016; Labs were the same date   Lives alone Married x 18 years  Has a girlfriend  Psycho social  Lives alone  has active Investment banker, corporate and teaches   Diet Chol/hdl 3; hdl 52; ldl 87; trig 102 BMI 25  Breakfast chic filet burrito  Most of the time; egg white, spinach omelette Lunch - 97   Exercise 11 hours of yoga per week   bP elevated and discussed and will see Dr. Martinique on the 18th  Health Maintenance Due  Topic Date Due  . PNA vac Low Risk Adult (1 of 2 - PCV13) 03/29/2014  . COLONOSCOPY  11/27/2016  . INFLUENZA VACCINE  03/28/2017   Declines pneumonia today  Will review at the [email or web address]  Takes 1000mg  of vitamin C   Colonoscopy 11/2006 Dr. Fuller Plan; due 11/2016 Is considering colo-guard, then asked if he did the colo-guard, can he still have the colonoscopy if he changes his mind. Told him to check with insurance and to discuss with Dr. Martinique on the 18th  Declined Zoster in 2017; Did not address shingrix today     PSA 07/2016 Former smoker; quit in 73;   Will check AAA due to smoking history No pack year data    Meds reviewed Not taking doxazosin Takes the other 3 meds ordered by Dr. Clance Boll and she left        Objective:    Today's Vitals   11/02/17 1304  BP: (!) 150/90  Pulse: 75  SpO2: 97%  Weight: 171 lb 8 oz (77.8 kg)  Height: 5\' 9"  (1.753 m)   Body mass index is 25.33 kg/m.  Advanced Directives 06/02/2016 03/05/2015  Does Patient Have a Medical Advance Directive? Yes Yes  Type of Advance Directive - Living will  Does patient want to make changes to medical advance directive? - No - Patient declined  Copy of [name] in Chart? No - copy requested No - copy requested    Current Medications (verified) Outpatient Encounter Medications as of 11/02/2017  Medication  Sig  . amLODipine (NORVASC) 5 MG tablet TAKE 1 TABLET ONE TIME DAILY FOR BLOOD PRESSURE  . B Complex Vitamins (VITAMIN B COMPLEX PO) Take by mouth.  . carvedilol (COREG) 12.5 MG tablet TAKE 1 TABLET (12.5 MG TOTAL) BY MOUTH 2 (TWO) TIMES DAILY.  Marland Kitchen doxazosin (CARDURA) 8 MG tablet   . enalapril (VASOTEC) 20 MG tablet TAKE 1 TABLET EVERY DAY  . loratadine (CLARITIN) 10 MG tablet Take 1 tablet (10 mg total) by mouth daily.  . vitamin C (ASCORBIC ACID) 500 MG tablet Take 500 mg by mouth daily.  Marland Kitchen VITAMIN E PO Take by mouth daily.   No facility-administered encounter medications on file as of 11/02/2017.     Allergies (verified) Penicillins   History: Past Medical History:  Diagnosis Date  . Diverticulosis   . Hyperlipidemia   . Hypertension   . Internal hemorrhoid   . Vitamin D deficiency    Past Surgical History:  Procedure Laterality Date  . INGUINAL HERNIA REPAIR    . NASAL SEPTUM SURGERY    . TONSILLECTOMY     Family History  Problem Relation Age of Onset  . Stroke Father   . Cancer Mother        Brain  . Heart attack Brother  Social History   Socioeconomic History  . Marital status: Single    Spouse name: Not on file  . Number of children: Not on file  . Years of education: Not on file  . Highest education level: Not on file  Social Needs  . Financial resource strain: Not on file  . Food insecurity - worry: Not on file  . Food insecurity - inability: Not on file  . Transportation needs - medical: Not on file  . Transportation needs - non-medical: Not on file  Occupational History  . Not on file  Tobacco Use  . Smoking status: Former Smoker    Packs/day: 0.50    Years: 4.00    Pack years: 2.00    Start date: 08/29/1967    Last attempt to quit: 08/29/1971    Years since quitting: 46.2  . Smokeless tobacco: Never Used  . Tobacco comment: discussed AAA   Substance and Sexual Activity  . Alcohol use: No  . Drug use: No  . Sexual activity: Not on file  Other  Topics Concern  . Not on file  Social History Narrative  . Not on file    Tobacco Counseling Counseling given: Yes Comment: discussed AAA    Clinical Intake:     Activities of Daily Living No flowsheet data found.   Immunizations and Health Maintenance Immunization History  Administered Date(s) Administered  . Influenza-Unspecified 05/17/2014  . Td 11/15/2007   Health Maintenance Due  Topic Date Due  . PNA vac Low Risk Adult (1 of 2 - PCV13) 03/29/2014  . COLONOSCOPY  11/27/2016  . INFLUENZA VACCINE  03/28/2017    Patient Care Team: Martinique, Betty G, MD as PCP - General (Family Medicine) Scott Artist, MD as Consulting Physician (Gastroenterology) [name] (Optometry) Scott Matin, MD as Consulting Physician (Dermatology)  Indicate any recent Medical Services you may have received from other than [facility] providers in the past year (date may be approximate).     Assessment:   This is a routine wellness examination for [name].  Hearing/Vision screen No exam data present  Dietary issues and exercise activities discussed:    Goals    . Weight (lb) < 160 lb (72.6 kg)     Start playing golf more  Slow down on sodium Start eating a healthier diet  Lean and green; vegetables and fruits  Check out  online nutrition programs as GumSearch.nl and http://vang.com/; fit84me; Look for foods with "whole" wheat; bran; oatmeal etc Shot at the farmer's markets in season for fresher choices  Watch for "hydrogenated" on the label of oils which are trans-fats.  Watch for "high fructose corn syrup" in snacks, yogurt or ketchup  Meats have less marbling; bright colored fruits and vegetables;  Canned; dump out liquid and wash vegetables. Be mindful of what we are eating  Portion control is essential to a health weight! Sit down; take a break and enjoy your meal; take smaller bites; put the fork down between bites;  It takes 20 minutes to get full; so check in  with your fullness cues and stop eating when you start to fill full             Depression Screen PHQ 2/9 Scores 08/16/2016 03/05/2015  PHQ - 2 Score 0 0    Fall Risk Fall Risk  08/16/2016 03/05/2015  Falls in the past year? No No     Cognitive Function: Ad8 score reviewed for issues:  Issues making decisions:  Less interest in  hobbies / activities:  Repeats questions, stories (family complaining):  Trouble using ordinary gadgets (microwave, computer, phone):  Forgets the month or year:   Mismanaging finances:   Remembering appts:  Daily problems with thinking and/or memory: Ad8 score is=0    Screening Tests Health Maintenance  Topic Date Due  . PNA vac Low Risk Adult (1 of 2 - PCV13) 03/29/2014  . COLONOSCOPY  11/27/2016  . INFLUENZA VACCINE  03/28/2017  . TETANUS/TDAP  11/14/2017  . COLON CANCER SCREENING ANNUAL FOBT  01/28/2018  . Hepatitis C Screening  Completed       Plan:      PCP Notes   Health Maintenance  He did have his flu vaccine last fall  Declines pneumonia today  Will review at the [email or web address] Education provided  Takes 1000mg  of vitamin C   Colonoscopy 11/2006 Dr. Fuller Plan; due 11/2016 Is considering colo-guard, then asked when leaving if he did the colo-guard, can he still have the colonoscopy if he changes his mind. Referred him to insurance. Will hold order for colonoscopy until he talks to Dr. Martinique on the 18th   Declined Zoster in 2017; did not address shingrix  due to time today   Abnormal Screens  Meds reviewed Not taking doxazosin x 1 year  Takes the other 3 meds ordered by Dr. Rachel Moulds BP elevated today. Will check it a couple of times prior to seeing Dr. Martinique and will cut back on high sodium foods   Referrals  None today;    Patient concerns; Hearing loss noted with hearing tool; 1000 in left ear States he does have ear wax and Dr. Martinique will check when in   Nurse Concerns; Using ear wax candles and  safety unknown.   Next PCP apt March 18th   I have personally reviewed and noted the following in the patient's chart:   . Medical and social history . Use of alcohol, tobacco or illicit drugs  . Current medications and supplements . Functional ability and status . Nutritional status . Physical activity . Advanced directives . List of other physicians . Hospitalizations, surgeries, and ER visits in previous 12 months . Vitals . Screenings to include cognitive, depression, and falls . Referrals and appointments  In addition, I have reviewed and discussed with patient certain preventive protocols, quality metrics, and best practice recommendations. A written personalized care plan for preventive services as well as general preventive health recommendations were provided to patient.     Wynetta Fines, RN   11/02/2017

## 2017-11-08 ENCOUNTER — Ambulatory Visit (HOSPITAL_COMMUNITY)
Admission: RE | Admit: 2017-11-08 | Discharge: 2017-11-08 | Disposition: A | Discharge status: Home / Self Care | Payer: Medicare HMO | Source: Ambulatory Visit | Attending: Cardiovascular Disease | Admitting: Cardiovascular Disease

## 2017-11-08 DIAGNOSIS — Z87891 Personal history of nicotine dependence: Secondary | ICD-10-CM | POA: Insufficient documentation

## 2017-11-08 DIAGNOSIS — Z136 Encounter for screening for cardiovascular disorders: Secondary | ICD-10-CM | POA: Diagnosis not present

## 2017-11-10 NOTE — Progress Notes (Signed)
I have reviewed documentation from this visit and I agree with recommendations given.  Betty G. Jordan, MD  [facility]. [place] office.   

## 2017-11-12 ENCOUNTER — Ambulatory Visit (INDEPENDENT_AMBULATORY_CARE_PROVIDER_SITE_OTHER): Payer: Medicare HMO | Admitting: Family Medicine

## 2017-11-12 ENCOUNTER — Encounter: Payer: Self-pay | Admitting: Family Medicine

## 2017-11-12 VITALS — BP 132/80 | HR 56 | Temp 97.9°F | Resp 12 | Ht 69.0 in | Wt 165.4 lb

## 2017-11-12 DIAGNOSIS — E559 Vitamin D deficiency, unspecified: Secondary | ICD-10-CM

## 2017-11-12 DIAGNOSIS — R0989 Other specified symptoms and signs involving the circulatory and respiratory systems: Secondary | ICD-10-CM

## 2017-11-12 DIAGNOSIS — I10 Essential (primary) hypertension: Secondary | ICD-10-CM | POA: Diagnosis not present

## 2017-11-12 DIAGNOSIS — N400 Enlarged prostate without lower urinary tract symptoms: Secondary | ICD-10-CM | POA: Diagnosis not present

## 2017-11-12 DIAGNOSIS — E785 Hyperlipidemia, unspecified: Secondary | ICD-10-CM | POA: Diagnosis not present

## 2017-11-12 DIAGNOSIS — Z0000 Encounter for general adult medical examination without abnormal findings: Secondary | ICD-10-CM

## 2017-11-12 DIAGNOSIS — R001 Bradycardia, unspecified: Secondary | ICD-10-CM | POA: Diagnosis not present

## 2017-11-12 LAB — BASIC METABOLIC PANEL
BUN: 11 mg/dL (ref 6–23)
CHLORIDE: 102 meq/L (ref 96–112)
CO2: 30 mEq/L (ref 19–32)
Calcium: 9.3 mg/dL (ref 8.4–10.5)
Creatinine, Ser: 0.95 mg/dL (ref 0.40–1.50)
GFR: 83.64 mL/min (ref >60.00)
GLUCOSE: 101 mg/dL — AB (ref 70–99)
POTASSIUM: 3.6 meq/L (ref 3.5–5.1)
SODIUM: 139 meq/L (ref 135–145)

## 2017-11-12 LAB — LIPID PANEL
CHOL/HDL RATIO: 4
CHOLESTEROL: 181 mg/dL (ref 0–200)
HDL: 43.9 mg/dL (ref >39.00)
LDL CALC: 117 mg/dL — AB (ref 0–99)
NonHDL: 137.5
Triglycerides: 101 mg/dL (ref 0.0–149.0)
VLDL: 20.2 mg/dL (ref 0.0–40.0)

## 2017-11-12 LAB — VITAMIN D 25 HYDROXY (VIT D DEFICIENCY, FRACTURES): VITD: 30.16 ng/mL (ref 30.00–100.00)

## 2017-11-12 LAB — PSA: PSA: 0.97 ng/mL (ref 0.10–4.00)

## 2017-11-12 MED ORDER — ENALAPRIL MALEATE 20 MG PO TABS: 20.0000 mg | ORAL_TABLET | Freq: Every day | ORAL | 1 refills | Status: DC

## 2017-11-12 MED ORDER — AMLODIPINE BESYLATE 5 MG PO TABS: ORAL_TABLET | ORAL | 2 refills | Status: DC

## 2017-11-12 MED ORDER — CARVEDILOL 6.25 MG PO TABS: 6.2500 mg | ORAL_TABLET | Freq: Two times a day (BID) | ORAL | 3 refills | Status: DC

## 2017-11-12 NOTE — Progress Notes (Signed)
HPI:  [name] is a 67 y.o.male here today for his routine physical examination.  Last CPE: 08/16/2016 He lives alone.  Regular exercise 3 or more times per week: Yes, walking 6 times per week. Following a healthy diet: Not consistently, started doing better 10 days ago.   He lives alone, he has daughter 2 daughters that check on him periodically.   Functional Status Survey: Is the patient deaf or have difficulty hearing?: No Does the patient have difficulty seeing, even when wearing glasses/contacts?: No Does the patient have difficulty concentrating, remembering, or making decisions?: No Does the patient have difficulty walking or climbing stairs?: No Does the patient have difficulty dressing or bathing?: No Does the patient have difficulty doing errands alone such as visiting a doctor's office or shopping?: No  Fall Risk  11/02/2017 08/16/2016 03/05/2015  Falls in the past year? No No No      Depression screen [facility] 2/9 11/02/2017  Decreased Interest 0  Down, Depressed, Hopeless 0  PHQ - 2 Score 0    Mini-Cog - 11/12/17 1034    Normal clock drawing test?  yes         Chronic medical problems: Hypertension, vitamin D deficiency, allergies,chronic back pain, and OA.  He has not had problems with his back since he stopped working at [employer].   Hx of STD's: Denies. He is sexually active. He is in a relation now but about to break up. He is not using condoms.    Immunization History  Administered Date(s) Administered  . Influenza-Unspecified 05/17/2014, 05/28/2017  . Td 11/15/2007    -Hep C screening: 07/2015   Last colon cancer screening: 11/2006. Last prostate ca screening:  Lab Results  Component Value Date   PSA 1.14 08/16/2016   PSA 1.15 08/19/2015   PSA 1.03 08/12/2014   Denies dysuria,increased urinary frequency, gross hematuria,or decreased urine output.   -Denies high alcohol intake or Hx of illicit drug use. Former smoker:  Smoked from 66 to 64 years old, no daily.    -Concerns and/or follow up today: He has not followed as recommended,last seen on 05/21/17 for acute visit and 08/16/16 for CPE.  HTN:  Currently he is on Carvedilol 25 mg bid,Amlodipine 5 mg daily,and Enalapril 20 mg daily. He is not checking BP's. Denies severe/frequent headache, visual changes, chest pain, dyspnea, palpitation, claudication, focal weakness, or edema.  He would like for me to "listen to my neck", a weeks ago while he was massaging his neck and "almost pass out." Dizziness that lasted a few minutes. No associated chest pain,palpitation,or diaphoresis.   He also would like an EKG.  Vit D deficiency, he is not on vit D supplementation.  HLD: He is on non pharmacologic treatment. Last FLP in 2017: TC 159,TG 102,HDL 52, and LDL 87   Review of Systems  Constitutional: Negative for activity change, appetite change, fatigue and fever.  HENT: Negative for dental problem, nosebleeds, sore throat, trouble swallowing and voice change.   Eyes: Negative for redness and visual disturbance.  Respiratory: Negative for apnea, cough, shortness of breath and wheezing.   Cardiovascular: Negative for chest pain, palpitations and leg swelling.  Gastrointestinal: Negative for abdominal pain, blood in stool, nausea and vomiting.  Endocrine: Negative for cold intolerance, heat intolerance, polydipsia, polyphagia and polyuria.  Genitourinary: Negative for decreased urine volume, dysuria, genital sores, hematuria and testicular pain.  Musculoskeletal: Negative for gait problem and myalgias.  Skin: Negative for color change and  rash.  Allergic/Immunologic: Positive for environmental allergies.  Neurological: Positive for dizziness. Negative for syncope, weakness and headaches.  Hematological: Negative for adenopathy. Does not bruise/bleed easily.  Psychiatric/Behavioral: Negative for confusion and sleep disturbance. The patient is  nervous/anxious.   All other systems reviewed and are negative.    Current Outpatient Medications on File Prior to Visit  Medication Sig Dispense Refill  . amLODipine (NORVASC) 5 MG tablet TAKE 1 TABLET ONE TIME DAILY FOR BLOOD PRESSURE 90 tablet 2  . B Complex Vitamins (VITAMIN B COMPLEX PO) Take by mouth.    . carvedilol (COREG) 12.5 MG tablet TAKE 1 TABLET (12.5 MG TOTAL) BY MOUTH 2 (TWO) TIMES DAILY. 180 tablet 2  . enalapril (VASOTEC) 20 MG tablet TAKE 1 TABLET EVERY DAY 90 tablet 1  . vitamin C (ASCORBIC ACID) 500 MG tablet Take 500 mg by mouth daily.    Marland Kitchen VITAMIN E PO Take by mouth daily.    Marland Kitchen loratadine (CLARITIN) 10 MG tablet Take 1 tablet (10 mg total) by mouth daily. 90 tablet 1   No current facility-administered medications on file prior to visit.      Past Medical History:  Diagnosis Date  . Diverticulosis   . Hyperlipidemia   . Hypertension   . Internal hemorrhoid   . Vitamin D deficiency     Past Surgical History:  Procedure Laterality Date  . INGUINAL HERNIA REPAIR    . NASAL SEPTUM SURGERY    . TONSILLECTOMY      Allergies  Allergen Reactions  . Penicillins     Family History  Problem Relation Age of Onset  . Stroke Father   . Cancer Mother        Brain  . Heart attack Brother     Social History   Socioeconomic History  . Marital status: Single    Spouse name: None  . Number of children: None  . Years of education: None  . Highest education level: None  Social Needs  . Financial resource strain: None  . Food insecurity - worry: None  . Food insecurity - inability: None  . Transportation needs - medical: None  . Transportation needs - non-medical: None  Occupational History  . None  Tobacco Use  . Smoking status: Former Smoker    Packs/day: 0.50    Years: 4.00    Pack years: 2.00    Start date: 08/29/1967    Last attempt to quit: 08/29/1971    Years since quitting: 46.2  . Smokeless tobacco: Never Used  . Tobacco comment: discussed  AAA and agreed to fup   Substance and Sexual Activity  . Alcohol use: No  . Drug use: No  . Sexual activity: None  Other Topics Concern  . None  Social History Narrative  . None     Vitals:   11/12/17 1000  BP: 132/80  Pulse: 60  Resp: 12  Temp: 97.9 F (36.6 C)  SpO2: 97%   Body mass index is 24.42 kg/m.   Wt Readings from Last 3 Encounters:  11/12/17 165 lb 6 oz (75 kg)  11/02/17 171 lb 8 oz (77.8 kg)  10/19/17 162 lb 11.2 oz (73.8 kg)      Physical Exam  Nursing note and vitals reviewed. Constitutional: He is oriented to person, place, and time. He appears well-developed and well-nourished. No distress.  HENT:  Head: Normocephalic and atraumatic.  Mouth/Throat: Oropharynx is clear and moist and mucous membranes are normal.  Eyes: Conjunctivae and EOM  are normal. Pupils are equal, round, and reactive to light.  Neck: Normal range of motion. No tracheal deviation present. No thyromegaly present.  Cardiovascular: Regular rhythm. Bradycardia present.  No murmur heard. Pulses:      Dorsalis pedis pulses are 2+ on the right side, and 2+ on the left side.  Respiratory: Effort normal and breath sounds normal. No respiratory distress.  GI: Soft. He exhibits no mass. There is no tenderness.  Genitourinary: Rectal exam shows no mass and no tenderness. Prostate is enlarged (mild,no nodules). Prostate is not tender.  Musculoskeletal: He exhibits no edema or tenderness.  No major deformities appreciated and no signs of synovitis.  Lymphadenopathy:    He has no cervical adenopathy.       Right: No supraclavicular adenopathy present.       Left: No supraclavicular adenopathy present.  Neurological: He is alert and oriented to person, place, and time. He has normal strength. No cranial nerve deficit or sensory deficit. Coordination and gait normal.  Reflex Scores:      Bicep reflexes are 2+ on the right side and 2+ on the left side.      Patellar reflexes are 2+ on the  right side and 2+ on the left side. Skin: Skin is warm. No erythema.  Psychiatric: His mood appears anxious.  Fairly groomed, good eye contact.     ASSESSMENT AND PLAN:  Mr. Kailan was seen today for annual exam.  Orders Placed This Encounter  Procedures  . VITAMIN D 25 Hydroxy (Vit-D Deficiency, Fractures)  . Lipid panel  . Basic metabolic panel  . PSA(Must document that pt has been informed of limitations of PSA testing.)  . EKG 12-Lead   Lab Results  Component Value Date   CHOL 181 11/12/2017   HDL 43.90 11/12/2017   LDLCALC 117 (H) 11/12/2017   TRIG 101.0 11/12/2017   CHOLHDL 4 11/12/2017   Lab Results  Component Value Date   CREATININE 0.95 11/12/2017   BUN 11 11/12/2017   NA 139 11/12/2017   K 3.6 11/12/2017   CL 102 11/12/2017   CO2 30 11/12/2017    Routine general medical examination at a health care facility  We discussed the importance of regular physical activity and healthy diet for prevention of chronic illness and/or complications. Preventive guidelines reviewed. Vaccination : Due for Tdap, not sure about insurance coverage,he can have it at his pharmacy. Recent abdominal US negative for aortic aneurysm. Next CPE in a year.  The 10-year ASCVD risk score Mikey Bussing DC Brooke Bonito., et al., 2013) is: 19.3%   Values used to calculate the score:     Age: 1 years     Sex: Male     Is Non-Hispanic African American: No     Diabetic: No     Tobacco smoker: No     Systolic Blood Pressure: 709 mmHg     Is BP treated: Yes     HDL Cholesterol: 43.9 mg/dL     Total Cholesterol: 181 mg/dL  Vitamin D deficiency  Further recommendations will be given according to lab results.  -     VITAMIN D 25 Hydroxy (Vit-D Deficiency, Fractures)  Benign prostatic hyperplasia, unspecified whether lower urinary tract symptoms present  Asymptomatic. Anxious about findings, explained that it is not unusual to have BPH at his age. Further recommendations will be given according to  PSA results.  -     PSA(Must document that pt has been informed of limitations of PSA testing.)  Bruit  of left carotid artery  Question of left-sided bruit. This could explained dizziness, other possible causes dicussed. Recommend avoiding massaging carotids, which seems like the area he was massaging when he had symptoms. Instructed about warning signs.  -     VAS US CAROTID; Future  Sinus bradycardia  EKG today mild sinus bradycardia,LAD,volatage criteria for LVH, normal intervals. No significant changes when compare with EKG in 07/2015. Possible etiologies discussed, it was present in prior EKG. Carvedilol decreased from 25 mg to 12.5 mg bid. Instructed to check HR and BP at home. F/U in 3 months.   Hypertension Adequately controlled. Because mild bradycardia, Carvedilol decreased from 12.5 twice daily to 6.25 twice daily. No changes on Amlodipine or Enalapril. DASH diet recommended. Eye exam recommended annually. F/U in 3 months, before if needed.   Hyperlipidemia Further recommendations will be given according to lab results. For now he will continue low fat diet.      Return in 3 months (on 02/12/2018) for HTN and bradycardia.      Betty G. Martinique, MD  [facility] [place] office.

## 2017-11-12 NOTE — Patient Instructions (Addendum)
A few things to remember from today's visit:   Hyperlipidemia, unspecified hyperlipidemia type - Plan: Lipid panel  Essential hypertension - Plan: EKG 08-QPYP, Basic metabolic panel  Vitamin D deficiency - Plan: VITAMIN D 25 Hydroxy (Vit-D Deficiency, Fractures)  Benign prostatic hyperplasia, unspecified whether lower urinary tract symptoms present - Plan: PSA(Must document that pt has been informed of limitations of PSA testing.)  Routine general medical examination at a health care facility   At least 150 minutes of moderate exercise per week, daily brisk walking for 15-30 min is a good exercise option. Healthy diet low in saturated (animal) fats and sweets and consisting of fresh fruits and vegetables, lean meats such as fish and white chicken and whole grains.  - Vaccines:   Shingles vaccine recommended at age 34, could be given after 69 years of age but not sure about insurance coverage.  Pneumonia vaccines:  Prevnar 26 at 34 and Pneumovax at 72.   -Screening recommendations for low/normal risk males:  Screening for diabetes at age 34 and every 3 years. Earlier screening if cardiovascular risk factors.   Colon cancer screening at age 68 and until age 61.  Prostate cancer screening: some controversy, starts usually at 66: Rectal exam and PSA.  Aortic Abdominal Aneurism once between 25 and 11 years old if ever smoker.  Also recommended:  1. Dental visit- Brush and floss your teeth twice daily; visit your dentist twice a year. 2. Eye doctor- Get an eye exam at least every 2 years. 3. Helmet use- Always wear a helmet when riding a bicycle, motorcycle, rollerblading or skateboarding. 4. Safe sex- If you may be exposed to sexually transmitted infections, use a condom. 5. Seat belts- Seat belts can save your live; always wear one. 6. Smoke/Carbon Monoxide detectors- These detectors need to be installed on the appropriate level of your home. Replace batteries at least once a  year. 7. Skin cancer- When out in the sun please cover up and use sunscreen 15 SPF or higher. 8. Violence- If anyone is threatening or hurting you, please tell your healthcare provider.  9. Drink alcohol in moderation- Limit alcohol intake to one drink or less per day. Never drink and drive.  Please be sure medication list is accurate. If a new problem present, please set up appointment sooner than planned today.

## 2017-11-12 NOTE — Assessment & Plan Note (Signed)
Further recommendations will be given according to lab results. For now he will continue low fat diet.

## 2017-11-12 NOTE — Assessment & Plan Note (Addendum)
Adequately controlled. Because mild bradycardia, Carvedilol decreased from 12.5 twice daily to 6.25 twice daily. No changes on Amlodipine or Enalapril. DASH diet recommended. Eye exam recommended annually. F/U in 3 months, before if needed.

## 2017-11-15 ENCOUNTER — Other Ambulatory Visit: Payer: Self-pay | Admitting: Family Medicine

## 2017-11-15 ENCOUNTER — Ambulatory Visit (HOSPITAL_COMMUNITY)
Admission: RE | Admit: 2017-11-15 | Discharge: 2017-11-15 | Disposition: A | Discharge status: Home / Self Care | Payer: Medicare HMO | Source: Ambulatory Visit | Attending: Cardiovascular Disease | Admitting: Cardiovascular Disease

## 2017-11-15 DIAGNOSIS — E785 Hyperlipidemia, unspecified: Secondary | ICD-10-CM | POA: Insufficient documentation

## 2017-11-15 DIAGNOSIS — I10 Essential (primary) hypertension: Secondary | ICD-10-CM | POA: Diagnosis not present

## 2017-11-15 DIAGNOSIS — Z87891 Personal history of nicotine dependence: Secondary | ICD-10-CM | POA: Insufficient documentation

## 2017-11-15 DIAGNOSIS — I6523 Occlusion and stenosis of bilateral carotid arteries: Secondary | ICD-10-CM | POA: Diagnosis not present

## 2017-11-15 DIAGNOSIS — R0989 Other specified symptoms and signs involving the circulatory and respiratory systems: Secondary | ICD-10-CM

## 2017-11-16 ENCOUNTER — Telehealth: Payer: Self-pay

## 2017-11-16 NOTE — Telephone Encounter (Signed)
Copied from [place] [id number]. Topic: General - Other >> Nov 16, 2017  3:08 PM Oneta Rack wrote:  Relation to pt: self   Call back number: [phone]   Reason for call:  Patient would like to discuss Bruit of left carotid artery stating he would like to know PCP recommendations, please advise       >> Nov 16, 2017  3:14 PM Oneta Rack wrote:  Relation to pt: self   Call back number: [phone]   Reason for call:  Patient would like to discuss Bruit of left carotid artery stating he would like to know PCP recommendations, please advise

## 2017-11-16 NOTE — Telephone Encounter (Signed)
During the visit he asked me to listen to his carotid arteries. I explained that I was not sure if I heard a left side carotid bruits (noise blood makes when going through artery).  I added Dx to his visit ,so I can arrange for carotid US.  If he still has more questions, we can arrange appt after carotid US is back,so I can go through results. Please arranged for 30 min appt.  Thanks, BJ

## 2017-11-17 DIAGNOSIS — H524 Presbyopia: Secondary | ICD-10-CM | POA: Diagnosis not present

## 2017-11-19 NOTE — Telephone Encounter (Signed)
Left message to give me call back concerning [place].

## 2017-11-20 ENCOUNTER — Encounter: Payer: Self-pay | Admitting: *Deleted

## 2017-11-20 NOTE — Telephone Encounter (Signed)
Patient has been called several times with no success, result letter mailed to patient's address on file.

## 2017-11-21 DIAGNOSIS — Z0100 Encounter for examination of eyes and vision without abnormal findings: Secondary | ICD-10-CM | POA: Diagnosis not present

## 2017-11-22 ENCOUNTER — Telehealth: Payer: Self-pay | Admitting: Family Medicine

## 2017-11-22 NOTE — Telephone Encounter (Signed)
Pt came in today get a copy of his last lab results and while here he want to speak to Manuela Schwartz about the Cologuard.  Pt would like to have a call concerning the Cologuard that was discuss in the visit with Manuela Schwartz on 11/02/17 that he stated he was told it was covered by his insurance.  Pt is aware that he would have to contact the insurance company to see if it covered he is adamant that Manuela Schwartz told him that it is covered.

## 2017-11-26 ENCOUNTER — Telehealth: Payer: Self-pay | Admitting: Family Medicine

## 2017-11-26 NOTE — Telephone Encounter (Signed)
Copied from [place] [id number]. Topic: General - Other >> Nov 26, 2017 11:24 AM Darl Householder, RMA wrote: Reason for CRM: Patient is requesting a call back from Wynetta Fines concerning a colon exam, please return pt call

## 2017-11-30 ENCOUNTER — Telehealth: Payer: Self-pay

## 2017-11-30 NOTE — Telephone Encounter (Signed)
Returned call to Mr. Sava. Stated that I told him insurance would pay for his cologuard and no one has called him. Per my AWV, I reminded Mr. Karnes that he had asked if insurance would pay for a colonoscopy after he had the cologuard if he changed his mind and decided to have a colonoscopy   I educated him on both and stated that  Unless he has a + result from cologuard, I am not sure if the insurance would pay for a colonoscopy  and referred him to his insurance company.  Now he states he wants the cologuard and noted that I would order today. He is not going to have the colonoscopy   Wynetta Fines RN   04/045 cologuard form was faxed to [employer] today

## 2017-12-11 DIAGNOSIS — Z1212 Encounter for screening for malignant neoplasm of rectum: Secondary | ICD-10-CM | POA: Diagnosis not present

## 2017-12-11 DIAGNOSIS — Z1211 Encounter for screening for malignant neoplasm of colon: Secondary | ICD-10-CM | POA: Diagnosis not present

## 2017-12-11 LAB — COLOGUARD: Cologuard: NEGATIVE

## 2018-01-04 ENCOUNTER — Telehealth: Payer: Self-pay | Admitting: Family Medicine

## 2018-01-04 ENCOUNTER — Encounter: Payer: Self-pay | Admitting: Family Medicine

## 2018-01-04 NOTE — Telephone Encounter (Signed)
Left message to give clinic a call back for cologuard results.

## 2018-01-04 NOTE — Telephone Encounter (Signed)
Copied from [place] [id number]. Topic: Inquiry >> Jan 04, 2018 10:27 AM Pricilla Handler wrote: Reason for CRM: Patient wants to speak with a Nurse in the office regarding his Colorguard result. Patient wants to know why it has taken over three weeks for results. I called a Triage Nurse. The triage nurse stated that there are no results at this time. Patient wants to speak with someone in the office today.   Call patient at       Thank You!!!

## 2018-01-04 NOTE — Telephone Encounter (Signed)
Left message informing of negative lab results, called patient 3 times this afternoon, the first time he told me to call back after I retrieved the results from the fax and the last two times he did not answer, so left voicemail.

## 2018-01-07 NOTE — Telephone Encounter (Signed)
Pt returning call about cologuard please call [phone]

## 2018-01-08 NOTE — Telephone Encounter (Signed)
Tried calling patient again, no answer, left voicemail.

## 2018-01-09 NOTE — Telephone Encounter (Signed)
Spoke with patient, gave lab results and instructions. Patient verbalized understanding.

## 2018-01-15 ENCOUNTER — Other Ambulatory Visit: Payer: Self-pay | Admitting: Family Medicine

## 2018-01-16 ENCOUNTER — Ambulatory Visit: Payer: Medicare HMO | Admitting: Family Medicine

## 2018-01-17 ENCOUNTER — Ambulatory Visit (INDEPENDENT_AMBULATORY_CARE_PROVIDER_SITE_OTHER): Payer: Medicare HMO | Admitting: Family Medicine

## 2018-01-17 ENCOUNTER — Encounter: Payer: Self-pay | Admitting: Family Medicine

## 2018-01-17 VITALS — BP 124/82 | HR 60 | Temp 97.9°F | Resp 16 | Ht 69.0 in | Wt 164.2 lb

## 2018-01-17 DIAGNOSIS — I10 Essential (primary) hypertension: Secondary | ICD-10-CM | POA: Diagnosis not present

## 2018-01-17 DIAGNOSIS — R05 Cough: Secondary | ICD-10-CM

## 2018-01-17 DIAGNOSIS — R053 Chronic cough: Secondary | ICD-10-CM

## 2018-01-17 DIAGNOSIS — J302 Other seasonal allergic rhinitis: Secondary | ICD-10-CM | POA: Insufficient documentation

## 2018-01-17 MED ORDER — BENZONATATE 100 MG PO CAPS: 200.0000 mg | ORAL_CAPSULE | Freq: Two times a day (BID) | ORAL | 0 refills | Status: AC | PRN

## 2018-01-17 MED ORDER — LOSARTAN POTASSIUM 50 MG PO TABS: 50.0000 mg | ORAL_TABLET | Freq: Every day | ORAL | 1 refills | Status: DC

## 2018-01-17 NOTE — Progress Notes (Signed)
ACUTE VISIT  HPI:  Chief Complaint  Patient presents with  . Cough    chest congestion, bad cough with some clear mucus    [name] is a 67 y.o.male here today complaining of 3 weeks of persistent productive cough with yellow sputum. Symptoms are started with upper respiratory symptoms, sore throat and fever, both of which have resolved. Cough is not debilitating, it does not interfere with daily activities or with a sleep. He is not sure about fever, no chills. No wheezing or dyspnea. He has not identified exacerbating or alleviating factors. + Former smoker.  Yesterday he was able to play golf all day with no problems while he was walking on the field.  He has history of seasonal allergies, he wonders if these symptoms are related to allergies. Hypertension on enalapril 20 mg daily and carvedilol 6.25 mg twice daily. No headache, visual changes, chest pain, dyspnea, palpitation,focal weakness, or edema.   Cough  The current episode started 1 to 4 weeks ago. The problem has been waxing and waning. The problem occurs every few hours. The cough is productive of sputum. Associated symptoms include postnasal drip. Pertinent negatives include no chills, ear congestion, ear pain, eye redness, fever, headaches, heartburn, hemoptysis, myalgias, nasal congestion, rash, rhinorrhea, sore throat, shortness of breath, sweats, weight loss or wheezing. Nothing aggravates the symptoms. Treatments tried: Ricola. His past medical history is significant for environmental allergies. There is no history of COPD.    No Hx of recent travel. No sick contact.   Review of Systems  Constitutional: Negative for activity change, appetite change, chills, fatigue, fever and weight loss.  HENT: Positive for postnasal drip. Negative for congestion, ear pain, mouth sores, rhinorrhea, sore throat and trouble swallowing.   Eyes: Negative for discharge, redness and itching.  Respiratory:  Positive for cough. Negative for hemoptysis, chest tightness, shortness of breath and wheezing.   Gastrointestinal: Negative for abdominal pain, diarrhea, heartburn, nausea and vomiting.  Musculoskeletal: Negative for gait problem and myalgias.  Skin: Negative for rash.  Allergic/Immunologic: Positive for environmental allergies.  Neurological: Negative for weakness and headaches.  Hematological: Negative for adenopathy. Does not bruise/bleed easily.      Current Outpatient Medications on File Prior to Visit  Medication Sig Dispense Refill  . amLODipine (NORVASC) 5 MG tablet TAKE 1 TABLET ONE TIME DAILY FOR BLOOD PRESSURE 90 tablet 2  . B Complex Vitamins (VITAMIN B COMPLEX PO) Take by mouth.    . carvedilol (COREG) 6.25 MG tablet TAKE 1 TABLET (6.25 MG TOTAL) BY MOUTH 2 (TWO) TIMES DAILY WITH A MEAL. 180 tablet 3  . vitamin C (ASCORBIC ACID) 500 MG tablet Take 500 mg by mouth daily.    Marland Kitchen VITAMIN E PO Take by mouth daily.    Marland Kitchen loratadine (CLARITIN) 10 MG tablet Take 1 tablet (10 mg total) by mouth daily. 90 tablet 1   No current facility-administered medications on file prior to visit.      Past Medical History:  Diagnosis Date  . Diverticulosis   . Hyperlipidemia   . Hypertension   . Internal hemorrhoid   . Vitamin D deficiency    Allergies  Allergen Reactions  . Penicillins     Social History   Socioeconomic History  . Marital status: Single    Spouse name: Not on file  . Number of children: Not on file  . Years of education: Not on file  . Highest education level: Not on file  Occupational History  . Not on file  Social Needs  . Financial resource strain: Not on file  . Food insecurity:    Worry: Not on file    Inability: Not on file  . Transportation needs:    Medical: Not on file    Non-medical: Not on file  Tobacco Use  . Smoking status: Former Smoker    Packs/day: 0.50    Years: 4.00    Pack years: 2.00    Start date: 08/29/1967    Last attempt to  quit: 08/29/1971    Years since quitting: 46.4  . Smokeless tobacco: Never Used  . Tobacco comment: discussed AAA and agreed to fup   Substance and Sexual Activity  . Alcohol use: No  . Drug use: No  . Sexual activity: Not on file  Lifestyle  . Physical activity:    Days per week: Not on file    Minutes per session: Not on file  . Stress: Not on file  Relationships  . Social connections:    Talks on phone: Not on file    Gets together: Not on file    Attends religious service: Not on file    Active member of club or organization: Not on file    Attends meetings of clubs or organizations: Not on file    Relationship status: Not on file  Other Topics Concern  . Not on file  Social History Narrative  . Not on file    Vitals:   01/17/18 1610  BP: 124/82  Pulse: 60  Resp: 16  Temp: 97.9 F (36.6 C)  SpO2: 97%   Body mass index is 24.26 kg/m.   Physical Exam  Nursing note and vitals reviewed. Constitutional: He is oriented to person, place, and time. He appears well-developed and well-nourished. He does not appear ill. No distress.  HENT:  Head: Normocephalic and atraumatic.  Mouth/Throat: Oropharynx is clear and moist and mucous membranes are normal.  Postnasal drainage.  Eyes: Pupils are equal, round, and reactive to light. Conjunctivae are normal.  Cardiovascular: Normal rate and regular rhythm.  No murmur heard. Respiratory: Effort normal. No stridor. No respiratory distress. He has no wheezes. He has rhonchi. He has no rales.  A few rhonchi upon expiration, diffuse. Improve with cough.  Lymphadenopathy:    He has no cervical adenopathy.  Neurological: He is alert and oriented to person, place, and time. He has normal strength. Gait normal.  Skin: Skin is warm. No rash noted. No erythema.  Psychiatric: His mood appears anxious.  Well groomed, good eye contact.      ASSESSMENT AND PLAN:  [name] was seen today for cough.  Diagnoses and all orders for  this visit:  Persistent cough  We discussed possible etiologies: Allergies, residual symptoms after URI, GERD, or infectious process among some. Auscultation today does not suggest a serious process, no rales or wheezing. Symptomatic treatment with benzonatate was recommended. CXR ordered. Plenty of p.o. fluids. OTC plain Mucinex may also help. Instructed about warning signs. Follow-up as needed.   -     benzonatate (TESSALON) 100 MG capsule; Take 2 capsules (200 mg total) by mouth 2 (two) times daily as needed for up to 10 days. -     DG Chest 2 View; Future   Hypertension Well-controlled. After discussing some side effects of enalapril, he would like to change to a different medication. Losartan 50 mg daily added today. No changes in carvedilol 6.25 mg twice daily. Continue low-salt  diet. 6 weeks BP check, nurse visit.  Seasonal allergies This problem could be contributing to his cough. He can continue Loratadine 10 mg daily as needed.   25 min face to face OV. > 50% was dedicated to discussion of differential Dx, reassurance, treatment options, and some side effects of medications as well as coordination of care.     Betty G. Martinique, MD  [facility]. [place] office.

## 2018-01-17 NOTE — Assessment & Plan Note (Signed)
This problem could be contributing to his cough. He can continue Loratadine 10 mg daily as needed.

## 2018-01-17 NOTE — Patient Instructions (Addendum)
A few things to remember from today's visit:   Persistent cough - Plan: benzonatate (TESSALON) 100 MG capsule, DG Chest 2 View  Essential hypertension - Plan: losartan (COZAAR) 50 MG tablet   Cough, Adult A cough helps to clear your throat and lungs. A cough may last only 2-3 weeks (acute), or it may last longer than 8 weeks (chronic). Many different things can cause a cough. A cough may be a sign of an illness or another medical condition. Follow these instructions at home:  Pay attention to any changes in your cough.  Take medicines only as told by your doctor. ? If you were prescribed an antibiotic medicine, take it as told by your doctor. Do not stop taking it even if you start to feel better. ? Talk with your doctor before you try using a cough medicine.  Drink enough fluid to keep your pee (urine) clear or pale yellow.  If the air is dry, use a cold steam vaporizer or humidifier in your home.  Stay away from things that make you cough at work or at home.  If your cough is worse at night, try using extra pillows to raise your head up higher while you sleep.  Do not smoke, and try not to be around smoke. If you need help quitting, ask your doctor.  Do not have caffeine.  Do not drink alcohol.  Rest as needed. Contact a doctor if:  You have new problems (symptoms).  You cough up yellow fluid (pus).  Your cough does not get better after 2-3 weeks, or your cough gets worse.  Medicine does not help your cough and you are not sleeping well.  You have pain that gets worse or pain that is not helped with medicine.  You have a fever.  You are losing weight and you do not know why.  You have night sweats. Get help right away if:  You cough up blood.  You have trouble breathing.  Your heartbeat is very fast. This information is not intended to replace advice given to you by your health care provider. Make sure you discuss any questions you have with your health care  provider. Document Released: 04/27/2011 Document Revised: 01/20/2016 Document Reviewed: 10/21/2014 Elsevier Interactive Patient Education  2018 [employer].  Please be sure medication list is accurate. If a new problem present, please set up appointment sooner than planned today.

## 2018-01-17 NOTE — Assessment & Plan Note (Signed)
Well-controlled. After discussing some side effects of enalapril, he would like to change to a different medication. Losartan 50 mg daily added today. No changes in carvedilol 6.25 mg twice daily. Continue low-salt diet. 6 weeks BP check, nurse visit.

## 2018-02-07 ENCOUNTER — Telehealth: Payer: Self-pay | Admitting: Family Medicine

## 2018-02-07 NOTE — Telephone Encounter (Signed)
Copied from [place] [id number]. Topic: General - Other >> Feb 07, 2018 12:06 PM Keene Breath wrote: Reason for CRM: Patient called to request a different type of BP medication that he discussed with the doctor.  His current BP medications are losartan (COZAAR) 50 MG tablet, carvedilol (COREG) 6.25 MG tablet and amLODipine (NORVASC) 5 MG tablet.  He is not sure which one is causing his throat to be scratchy and irritated but when he mentioned it to the doctor, she said she could switch him to a different medication.  He does not remember the name of the medication she advised.  CB#[id number].

## 2018-02-08 ENCOUNTER — Other Ambulatory Visit: Payer: Self-pay | Admitting: *Deleted

## 2018-02-08 DIAGNOSIS — I10 Essential (primary) hypertension: Secondary | ICD-10-CM

## 2018-02-08 MED ORDER — LOSARTAN POTASSIUM 50 MG PO TABS: 50.0000 mg | ORAL_TABLET | Freq: Every day | ORAL | 1 refills | Status: DC

## 2018-02-08 NOTE — Telephone Encounter (Signed)
Message sent to Dr. Jordan for review and approval. 

## 2018-02-08 NOTE — Telephone Encounter (Signed)
Cozaar can sometimes cause cough, it is not very common. He could discontinue Cozaar 50 mg, increased Norvasc from 5 mg to 10 mg, and no changes in Coreg. Follow-up in 4 weeks. Thanks, BJ

## 2018-02-08 NOTE — Telephone Encounter (Signed)
Left message for patient to return call. Per Dr. Martinique, enalapril was d/c, which could be causing the cough. Cozaar 50 mg was added, no changes in Coreg. Patient can start Cozaar and follow-up in 4 weeks since he hasn't started it yet. Cozaar was sent to [facility].

## 2018-02-08 NOTE — Telephone Encounter (Signed)
Left message to give clinic a call back concerning medication. 

## 2018-02-13 NOTE — Telephone Encounter (Signed)
Spoke with patient and gave instructions per Dr. Jordan. Patient verbalized understanding. 

## 2018-02-15 NOTE — Telephone Encounter (Signed)
Copied from [place] [id number]. Topic: General - Other >> Feb 15, 2018  2:30 PM Boyd Kerbs wrote: Pt is worried about taking Lorsartan with all the recalls  Asking if can get a different prescription.

## 2018-02-19 NOTE — Telephone Encounter (Addendum)
Left a message for a return call.  Pt needs to be notified to call his pharmacy to see if his medication has been recalled.  This will be determined by the lot # he was given.  This office does not know what lot # he was given at the pharmacy.  CRM created.

## 2018-03-01 NOTE — Telephone Encounter (Signed)
Patient given instructions, no further assistance needed at this time.

## 2018-03-08 ENCOUNTER — Encounter: Payer: Self-pay | Admitting: Family Medicine

## 2018-03-08 ENCOUNTER — Ambulatory Visit: Payer: Self-pay | Admitting: *Deleted

## 2018-03-08 ENCOUNTER — Ambulatory Visit: Payer: Medicare HMO | Admitting: Family Medicine

## 2018-03-08 ENCOUNTER — Ambulatory Visit (INDEPENDENT_AMBULATORY_CARE_PROVIDER_SITE_OTHER): Payer: Medicare HMO | Admitting: Family Medicine

## 2018-03-08 VITALS — BP 140/90 | HR 86 | Temp 98.2°F | Resp 12 | Ht 69.0 in | Wt 168.1 lb

## 2018-03-08 DIAGNOSIS — H6123 Impacted cerumen, bilateral: Secondary | ICD-10-CM

## 2018-03-08 DIAGNOSIS — L738 Other specified follicular disorders: Secondary | ICD-10-CM | POA: Diagnosis not present

## 2018-03-08 DIAGNOSIS — H9202 Otalgia, left ear: Secondary | ICD-10-CM

## 2018-03-08 DIAGNOSIS — D3613 Benign neoplasm of peripheral nerves and autonomic nervous system of lower limb, including hip: Secondary | ICD-10-CM | POA: Diagnosis not present

## 2018-03-08 DIAGNOSIS — L812 Freckles: Secondary | ICD-10-CM | POA: Diagnosis not present

## 2018-03-08 DIAGNOSIS — I10 Essential (primary) hypertension: Secondary | ICD-10-CM | POA: Diagnosis not present

## 2018-03-08 DIAGNOSIS — L821 Other seborrheic keratosis: Secondary | ICD-10-CM | POA: Diagnosis not present

## 2018-03-08 NOTE — Telephone Encounter (Signed)
Patient calling with concerns of heart rate being 86 today during his office visit. Pt would like to know what would cause his pulse to be increased from it's normal range. Pt states that his pulse has never been more than 72. Pt was seen in the office today for an earache and pt was advised that pain could cause an increase in the BP and pulse. Pt concerned that increase pulse could be coming from increased salt intake after eating a tuna sandwich from [place] today before appt.Pt also mentions that he was out in the sun today swing his golf club before coming in the office today. Pt advised that all these factors could have attributed to his pulse being higher than his normal range. Pt verbalized understanding and voices no other concerns at this time.

## 2018-03-08 NOTE — Patient Instructions (Signed)
A few things to remember from today's visit:   Earache on left  Bilateral impacted cerumen  Essential hypertension  Debrox eardrops 2 to 3 drops in each ear twice daily. No Q-tips or tissues in ears. Blood pressure mildly elevated, I recommend starting Cozaar.  Please be sure medication list is accurate. If a new problem present, please set up appointment sooner than planned today.

## 2018-03-08 NOTE — Progress Notes (Signed)
ACUTE VISIT   HPI:  Chief Complaint  Patient presents with  . Otalgia    left ear pain started 3 days ago    [name] is a 67 y.o. male, who is here today complaining of 3 days of left ear ache, "soreness" exacerbated with with manipulating external ear. "Really painful." He feels like ear canal is swollen.  He denies changes in hearing or ear drainage. No recent URI. No history of trauma but he usually places a rolled tissue inside the left ear after showers to dry water. He thinks he might have forgotten tissue inside left ear. Ear ache is better today. He has not noted fever, chills, sore throat, or erythema/edema around external ear.  He has not taking OTC medication.   BP mildly elevated today. He has not started Cozaar 50 mg because one of his friends had a "reaction" with his medication. He is still taking amlodipine 5 mg daily and carvedilol 6.25 mg twice daily.  He is not checking BP at home.   Review of Systems  Constitutional: Negative for chills, fatigue and fever.  HENT: Positive for ear pain. Negative for ear discharge, facial swelling, hearing loss, mouth sores and sore throat.   Respiratory: Negative for cough, shortness of breath and wheezing.   Skin: Negative for color change and rash.  Neurological: Negative for weakness and headaches.  Hematological: Negative for adenopathy. Does not bruise/bleed easily.  Psychiatric/Behavioral: Negative for confusion. The patient is nervous/anxious.       Current Outpatient Medications on File Prior to Visit  Medication Sig Dispense Refill  . amLODipine (NORVASC) 5 MG tablet TAKE 1 TABLET ONE TIME DAILY FOR BLOOD PRESSURE 90 tablet 2  . B Complex Vitamins (VITAMIN B COMPLEX PO) Take by mouth.    . carvedilol (COREG) 6.25 MG tablet TAKE 1 TABLET (6.25 MG TOTAL) BY MOUTH 2 (TWO) TIMES DAILY WITH A MEAL. 180 tablet 3  . enalapril (VASOTEC) 20 MG tablet     . vitamin C (ASCORBIC ACID) 500 MG  tablet Take 500 mg by mouth daily.    Marland Kitchen VITAMIN E PO Take by mouth daily.    Marland Kitchen loratadine (CLARITIN) 10 MG tablet Take 1 tablet (10 mg total) by mouth daily. 90 tablet 1  . losartan (COZAAR) 50 MG tablet Take 1 tablet (50 mg total) by mouth daily. (Patient not taking: Reported on 03/08/2018) 90 tablet 1   No current facility-administered medications on file prior to visit.      Past Medical History:  Diagnosis Date  . Diverticulosis   . Hyperlipidemia   . Hypertension   . Internal hemorrhoid   . Vitamin D deficiency    Allergies  Allergen Reactions  . Penicillins     Social History   Socioeconomic History  . Marital status: Single    Spouse name: Not on file  . Number of children: Not on file  . Years of education: Not on file  . Highest education level: Not on file  Occupational History  . Not on file  Social Needs  . Financial resource strain: Not on file  . Food insecurity:    Worry: Not on file    Inability: Not on file  . Transportation needs:    Medical: Not on file    Non-medical: Not on file  Tobacco Use  . Smoking status: Former Smoker    Packs/day: 0.50    Years: 4.00    Pack years: 2.00  Start date: 08/29/1967    Last attempt to quit: 08/29/1971    Years since quitting: 46.5  . Smokeless tobacco: Never Used  . Tobacco comment: discussed AAA and agreed to fup   Substance and Sexual Activity  . Alcohol use: No  . Drug use: No  . Sexual activity: Not on file  Lifestyle  . Physical activity:    Days per week: Not on file    Minutes per session: Not on file  . Stress: Not on file  Relationships  . Social connections:    Talks on phone: Not on file    Gets together: Not on file    Attends religious service: Not on file    Active member of club or organization: Not on file    Attends meetings of clubs or organizations: Not on file    Relationship status: Not on file  Other Topics Concern  . Not on file  Social History Narrative  . Not on file     Vitals:   03/08/18 1456  BP: 140/90  Pulse: 86  Resp: 12  Temp: 98.2 F (36.8 C)  SpO2: 97%   Body mass index is 24.83 kg/m.  Physical Exam  Nursing note and vitals reviewed. Constitutional: He is oriented to person, place, and time. He appears well-developed and well-nourished. He does not appear ill. No distress.  HENT:  Head: Normocephalic and atraumatic.  Right Ear: External ear normal. No swelling or tenderness. No mastoid tenderness.  Left Ear: External ear normal. No swelling or tenderness. No mastoid tenderness.  Mouth/Throat: Oropharynx is clear and moist and mucous membranes are normal.  Cerumen excess bilateral, not able to see TM.  Eyes: Conjunctivae and EOM are normal.  Cardiovascular: Normal rate and regular rhythm.  Respiratory: Effort normal. No respiratory distress.  Lymphadenopathy:       Head (right side): No submandibular, no preauricular and no posterior auricular adenopathy present.       Head (left side): No submandibular, no preauricular and no posterior auricular adenopathy present.    He has no cervical adenopathy.  Neurological: He is alert and oriented to person, place, and time. He has normal strength. Gait normal.  Skin: Skin is warm. No rash noted. No erythema.  Psychiatric: His mood appears anxious.  Well groomed, good eye contact.    ASSESSMENT AND PLAN:   [name] was seen today for otalgia.  Diagnoses and all orders for this visit:  Earache on left  We discussed possible causes. He is reporting improvement today. Ear examination negative for edema or erythema, no pain elicited during examination. Instructed to avoid putting tissues inside the ears.  Bilateral impacted cerumen  He is not reporting hearing changes, so ear lavage was not done today. Recommend OTC Debrox daily. Avoid Q-tips. Follow-up as needed.  Essential hypertension  BP mildly elevated today. We discussed some side effects of Cozaar, recommend try  medication. No changes in Coreg or amlodipine. Keep follow-up appointment.   Return if symptoms worsen or fail to improve.     Betty G. Martinique, MD  [facility]. [place] office.

## 2018-06-12 ENCOUNTER — Other Ambulatory Visit: Payer: Self-pay | Admitting: Family Medicine

## 2018-06-12 DIAGNOSIS — I10 Essential (primary) hypertension: Secondary | ICD-10-CM

## 2018-06-24 ENCOUNTER — Other Ambulatory Visit: Payer: Self-pay | Admitting: Family Medicine

## 2018-06-24 DIAGNOSIS — I10 Essential (primary) hypertension: Secondary | ICD-10-CM

## 2018-07-23 ENCOUNTER — Encounter (HOSPITAL_COMMUNITY): Payer: Self-pay

## 2018-07-23 ENCOUNTER — Emergency Department (HOSPITAL_COMMUNITY): Payer: Medicare HMO

## 2018-07-23 ENCOUNTER — Other Ambulatory Visit: Payer: Self-pay

## 2018-07-23 ENCOUNTER — Inpatient Hospital Stay (HOSPITAL_COMMUNITY)
Admission: EM | Admit: 2018-07-23 | Discharge: 2018-07-25 | DRG: 065 | Disposition: A | Discharge status: Home / Self Care | Payer: Medicare HMO | Attending: Neurology | Admitting: Neurology

## 2018-07-23 DIAGNOSIS — I619 Nontraumatic intracerebral hemorrhage, unspecified: Secondary | ICD-10-CM | POA: Diagnosis not present

## 2018-07-23 DIAGNOSIS — E876 Hypokalemia: Secondary | ICD-10-CM | POA: Diagnosis not present

## 2018-07-23 DIAGNOSIS — N400 Enlarged prostate without lower urinary tract symptoms: Secondary | ICD-10-CM | POA: Diagnosis not present

## 2018-07-23 DIAGNOSIS — Z808 Family history of malignant neoplasm of other organs or systems: Secondary | ICD-10-CM

## 2018-07-23 DIAGNOSIS — E785 Hyperlipidemia, unspecified: Secondary | ICD-10-CM | POA: Diagnosis not present

## 2018-07-23 DIAGNOSIS — Z8673 Personal history of transient ischemic attack (TIA), and cerebral infarction without residual deficits: Secondary | ICD-10-CM | POA: Diagnosis present

## 2018-07-23 DIAGNOSIS — R413 Other amnesia: Secondary | ICD-10-CM | POA: Diagnosis not present

## 2018-07-23 DIAGNOSIS — Z9089 Acquired absence of other organs: Secondary | ICD-10-CM | POA: Diagnosis not present

## 2018-07-23 DIAGNOSIS — Z823 Family history of stroke: Secondary | ICD-10-CM | POA: Diagnosis not present

## 2018-07-23 DIAGNOSIS — I77819 Aortic ectasia, unspecified site: Secondary | ICD-10-CM | POA: Diagnosis present

## 2018-07-23 DIAGNOSIS — Z880 Allergy status to penicillin: Secondary | ICD-10-CM | POA: Diagnosis not present

## 2018-07-23 DIAGNOSIS — E559 Vitamin D deficiency, unspecified: Secondary | ICD-10-CM | POA: Diagnosis present

## 2018-07-23 DIAGNOSIS — I161 Hypertensive emergency: Secondary | ICD-10-CM | POA: Diagnosis not present

## 2018-07-23 DIAGNOSIS — R29701 NIHSS score 1: Secondary | ICD-10-CM | POA: Diagnosis present

## 2018-07-23 DIAGNOSIS — I700 Atherosclerosis of aorta: Secondary | ICD-10-CM | POA: Diagnosis not present

## 2018-07-23 DIAGNOSIS — J302 Other seasonal allergic rhinitis: Secondary | ICD-10-CM | POA: Diagnosis present

## 2018-07-23 DIAGNOSIS — K648 Other hemorrhoids: Secondary | ICD-10-CM | POA: Diagnosis not present

## 2018-07-23 DIAGNOSIS — Z8249 Family history of ischemic heart disease and other diseases of the circulatory system: Secondary | ICD-10-CM | POA: Diagnosis not present

## 2018-07-23 DIAGNOSIS — I370 Nonrheumatic pulmonary valve stenosis: Secondary | ICD-10-CM | POA: Diagnosis not present

## 2018-07-23 DIAGNOSIS — I6509 Occlusion and stenosis of unspecified vertebral artery: Secondary | ICD-10-CM | POA: Diagnosis not present

## 2018-07-23 DIAGNOSIS — I10 Essential (primary) hypertension: Secondary | ICD-10-CM

## 2018-07-23 DIAGNOSIS — R4781 Slurred speech: Secondary | ICD-10-CM | POA: Diagnosis not present

## 2018-07-23 DIAGNOSIS — Z87891 Personal history of nicotine dependence: Secondary | ICD-10-CM

## 2018-07-23 DIAGNOSIS — R51 Headache: Secondary | ICD-10-CM | POA: Diagnosis not present

## 2018-07-23 DIAGNOSIS — I610 Nontraumatic intracerebral hemorrhage in hemisphere, subcortical: Secondary | ICD-10-CM | POA: Diagnosis not present

## 2018-07-23 DIAGNOSIS — K5790 Diverticulosis of intestine, part unspecified, without perforation or abscess without bleeding: Secondary | ICD-10-CM | POA: Diagnosis present

## 2018-07-23 LAB — URINALYSIS, ROUTINE W REFLEX MICROSCOPIC
Bilirubin Urine: NEGATIVE
GLUCOSE, UA: NEGATIVE mg/dL
Hgb urine dipstick: NEGATIVE
Ketones, ur: NEGATIVE mg/dL
LEUKOCYTES UA: NEGATIVE
Nitrite: NEGATIVE
PROTEIN: NEGATIVE mg/dL
SPECIFIC GRAVITY, URINE: 1.006 (ref 1.005–1.030)
pH: 8 (ref 5.0–8.0)

## 2018-07-23 LAB — DIFFERENTIAL
Abs Immature Granulocytes: 0.01 10*3/uL (ref 0.00–0.07)
BASOS PCT: 0 %
Basophils Absolute: 0 10*3/uL (ref 0.0–0.1)
EOS ABS: 0.2 10*3/uL (ref 0.0–0.5)
EOS PCT: 3 %
Immature Granulocytes: 0 %
LYMPHS ABS: 1.1 10*3/uL (ref 0.7–4.0)
Lymphocytes Relative: 17 %
MONO ABS: 0.6 10*3/uL (ref 0.1–1.0)
MONOS PCT: 9 %
NEUTROS PCT: 71 %
Neutro Abs: 4.3 10*3/uL (ref 1.7–7.7)

## 2018-07-23 LAB — COMPREHENSIVE METABOLIC PANEL
ALT: 17 U/L (ref 0–44)
AST: 21 U/L (ref 15–41)
Albumin: 4.6 g/dL (ref 3.5–5.0)
Alkaline Phosphatase: 71 U/L (ref 38–126)
Anion gap: 7 (ref 5–15)
BILIRUBIN TOTAL: 1 mg/dL (ref 0.3–1.2)
BUN: 14 mg/dL (ref 8–23)
CHLORIDE: 103 mmol/L (ref 98–111)
CO2: 29 mmol/L (ref 22–32)
Calcium: 9.2 mg/dL (ref 8.9–10.3)
Creatinine, Ser: 1.09 mg/dL (ref 0.61–1.24)
GFR calc non Af Amer: >60 mL/min (ref >60)
Glucose, Bld: 119 mg/dL — ABNORMAL HIGH (ref 70–99)
POTASSIUM: 3.2 mmol/L — AB (ref 3.5–5.1)
Sodium: 139 mmol/L (ref 135–145)
TOTAL PROTEIN: 7.6 g/dL (ref 6.5–8.1)

## 2018-07-23 LAB — CBC
HCT: 43.2 % (ref 39.0–52.0)
Hemoglobin: 14.4 g/dL (ref 13.0–17.0)
MCH: 30.4 pg (ref 26.0–34.0)
MCHC: 33.3 g/dL (ref 30.0–36.0)
MCV: 91.1 fL (ref 80.0–100.0)
PLATELETS: 227 10*3/uL (ref 150–400)
RBC: 4.74 MIL/uL (ref 4.22–5.81)
RDW: 13.2 % (ref 11.5–15.5)
WBC: 6.1 10*3/uL (ref 4.0–10.5)
nRBC: 0 % (ref 0.0–0.2)

## 2018-07-23 LAB — PROTIME-INR
INR: 0.99
PROTHROMBIN TIME: 13 s (ref 11.4–15.2)

## 2018-07-23 LAB — RAPID URINE DRUG SCREEN, HOSP PERFORMED
Amphetamines: NOT DETECTED
BARBITURATES: NOT DETECTED
BENZODIAZEPINES: NOT DETECTED
COCAINE: NOT DETECTED
Opiates: NOT DETECTED
Tetrahydrocannabinol: NOT DETECTED

## 2018-07-23 LAB — ETHANOL

## 2018-07-23 LAB — I-STAT TROPONIN, ED: TROPONIN I, POC: 0 ng/mL (ref 0.00–0.08)

## 2018-07-23 LAB — APTT: APTT: 30 s (ref 24–36)

## 2018-07-23 MED ORDER — CLEVIDIPINE BUTYRATE 0.5 MG/ML IV EMUL
0.0000 mg/h | INTRAVENOUS | Status: DC
  Administered 2018-07-23: 1 mg/h via INTRAVENOUS
  Administered 2018-07-24 (×2): 21 mg/h via INTRAVENOUS
  Administered 2018-07-24: 15 mg/h via INTRAVENOUS
  Administered 2018-07-24: 16 mg/h via INTRAVENOUS
  Administered 2018-07-24: 21 mg/h via INTRAVENOUS
  Administered 2018-07-24: 20 mg/h via INTRAVENOUS
  Administered 2018-07-24: 10 mg/h via INTRAVENOUS
  Filled 2018-07-23 (×9): qty 50

## 2018-07-23 NOTE — ED Triage Notes (Signed)
Pt reports that he was having sex with his girlfriend and when they were finished, he began experiencing slurred speech around 730p, he states that he cant really remember that conversation. No current facial droop. PERRLA. Grip strengths are equal and strong bilaterally. He has no trouble with ambulation. A&Ox4. He does state that he feels he is still slurring his speech. Pt has a hx of HTN and has not taken his nightly meds yet.

## 2018-07-23 NOTE — ED Notes (Signed)
Patient transported to MRI 

## 2018-07-23 NOTE — ED Provider Notes (Signed)
[facility] DEPT Provider Note   CSN: [id number] Arrival date & time: 07/23/18  2029     History   Chief Complaint Chief Complaint  Patient presents with  . Slurred Speech    HPI [name] is a 67 y.o. male.  Patient presents the emergency department with complaint of slurred speech starting approximately 7:30 PM.  Patient has a history of hypertension high cholesterol.  Patient states that after sexual intercourse tonight, his girlfriend stated that he "sounded funny".  He also noticed this as well.  He did not have a headache at any time.  No neck pain or fever.  He denies having any facial droop or facial numbness or tingling with the symptoms.  He decided that he should come to the emergency department for further evaluation.  He has never had symptoms like this in the past.  Patient denies signs of stroke including: facial droop, aphasia, weakness/numbness in extremities, imbalance/trouble walking.  He states that his symptoms have gradually been improving and are nearly resolved.  Patient is on blood pressure medication and he thinks that he took it this morning, however is not completely sure.     Past Medical History:  Diagnosis Date  . Diverticulosis   . Hyperlipidemia   . Hypertension   . Internal hemorrhoid   . Vitamin D deficiency     Patient Active Problem List   Diagnosis Date Noted  . Seasonal allergies 01/17/2018  . BPH (benign prostatic hyperplasia) 03/05/2015  . Medication management 08/12/2014  . Hyperlipidemia   . Hypertension   . Vitamin D deficiency     Past Surgical History:  Procedure Laterality Date  . INGUINAL HERNIA REPAIR    . NASAL SEPTUM SURGERY    . TONSILLECTOMY          Home Medications    Prior to Admission medications   Medication Sig Start Date End Date Taking? Authorizing Provider  amLODipine (NORVASC) 5 MG tablet TAKE 1 TABLET ONE TIME DAILY FOR BLOOD PRESSURE 06/12/18   Martinique, Betty  G, MD  B Complex Vitamins (VITAMIN B COMPLEX PO) Take by mouth.    [provider]  carvedilol (COREG) 6.25 MG tablet TAKE 1 TABLET (6.25 MG TOTAL) BY MOUTH 2 (TWO) TIMES DAILY WITH A MEAL. 01/15/18   Martinique, Betty G, MD  enalapril (VASOTEC) 20 MG tablet  02/04/18   [provider]  loratadine (CLARITIN) 10 MG tablet Take 1 tablet (10 mg total) by mouth daily. 11/08/15 11/07/16  Vicie Mutters, PA-C  losartan (COZAAR) 50 MG tablet TAKE 1 TABLET EVERY DAY 06/25/18   Martinique, Betty G, MD  vitamin C (ASCORBIC ACID) 500 MG tablet Take 500 mg by mouth daily.    [provider]  VITAMIN E PO Take by mouth daily.    [provider]    Family History Family History  Problem Relation Age of Onset  . Stroke Father   . Cancer Mother        Brain  . Heart attack Brother     Social History Social History   Tobacco Use  . Smoking status: Former Smoker    Packs/day: 0.50    Years: 4.00    Pack years: 2.00    Start date: 08/29/1967    Last attempt to quit: 08/29/1971    Years since quitting: 46.9  . Smokeless tobacco: Never Used  . Tobacco comment: discussed AAA and agreed to fup   Substance Use Topics  .  Alcohol use: No  . Drug use: No     Allergies   Penicillins   Review of Systems Review of Systems   Physical Exam Updated Vital Signs BP (!) 193/120   Pulse 77   Temp 98.6 F (37 C) (Oral)   Resp (!) 26   Ht 5\' 10"  (1.778 m)   Wt 75.8 kg   SpO2 95%   BMI 23.96 kg/m   Physical Exam  Constitutional: He is oriented to person, place, and time. He appears well-developed and well-nourished.  HENT:  Head: Normocephalic and atraumatic.  Right Ear: Tympanic membrane, external ear and ear canal normal.  Left Ear: Tympanic membrane, external ear and ear canal normal.  Nose: Nose normal.  Mouth/Throat: Uvula is midline, oropharynx is clear and moist and mucous membranes are normal.  Eyes: Pupils are equal, round, and reactive to light. Conjunctivae,  EOM and lids are normal.  Neck: Normal range of motion. Neck supple.  No carotid bruits.  Cardiovascular: Normal rate and regular rhythm.  Pulmonary/Chest: Effort normal and breath sounds normal.  Abdominal: Soft. There is no tenderness.  Musculoskeletal: Normal range of motion.       Cervical back: He exhibits normal range of motion, no tenderness and no bony tenderness.  Neurological: He is alert and oriented to person, place, and time. He has normal strength and normal reflexes. No cranial nerve deficit or sensory deficit. He exhibits normal muscle tone. He displays a negative Romberg sign. Coordination and gait normal. GCS eye subscore is 4. GCS verbal subscore is 5. GCS motor subscore is 6.  Skin: Skin is warm and dry.  Psychiatric: He has a normal mood and affect.  Nursing note and vitals reviewed.    ED Treatments / Results  Labs (all labs ordered are listed, but only abnormal results are displayed) Labs Reviewed  COMPREHENSIVE METABOLIC PANEL - Abnormal; Notable for the following components:      Result Value   Potassium 3.2 (*)    Glucose, Bld 119 (*)    All other components within normal limits  URINALYSIS, ROUTINE W REFLEX MICROSCOPIC - Abnormal; Notable for the following components:   Color, Urine STRAW (*)    All other components within normal limits  PROTIME-INR  APTT  CBC  DIFFERENTIAL  RAPID URINE DRUG SCREEN, HOSP PERFORMED  ETHANOL  I-STAT TROPONIN, ED    ED ECG REPORT   Date: 07/23/2018  Rate: 66  Rhythm: normal sinus rhythm  QRS Axis: normal  Intervals: normal  ST/T Wave abnormalities: normal  Conduction Disutrbances:none  Narrative Interpretation:   Old EKG Reviewed: changes noted; faster today  I have personally reviewed the EKG tracing and agree with the computerized printout as noted.  Radiology Mr Brain Wo Contrast  Result Date: 07/23/2018 CLINICAL DATA:  Slurred speech and short-term memory loss. EXAM: MRI HEAD WITHOUT CONTRAST  TECHNIQUE: Multiplanar, multiecho pulse sequences of the brain and surrounding structures were obtained without intravenous contrast. COMPARISON:  None. FINDINGS: BRAIN: There is abnormal diffusion restriction within the left basal ganglia. The midline structures are normal. No midline shift or other mass effect. Early confluent hyperintense T2-weighted signal of the periventricular and deep white matter, most commonly due to chronic ischemic microangiopathy. The cerebral and cerebellar volume are age-appropriate. There is peripheral magnetic susceptibility effect at the site of the diffusion abnormality. VASCULAR: Major intracranial arterial and venous sinus flow voids are normal. SKULL AND UPPER CERVICAL SPINE: Calvarial bone marrow signal is normal. There is no skull base mass.  Visualized upper cervical spine and soft tissues are normal. SINUSES/ORBITS: No fluid levels or advanced mucosal thickening. No mastoid or middle ear effusion. The orbits are normal. IMPRESSION: 1. Abnormal diffusion characteristics and magnetic susceptibility effect within the left basal ganglia, concerning for acute intraparenchymal hemorrhage. Alternative consideration would be ischemic infarct with mild petechial hemorrhage. Head CT would provide clarification. 2. No hydrocephalus, herniation or other mass effect. 3. Chronic ischemic microangiopathy. Critical Value/emergent results were called by telephone at the time of interpretation on 07/23/2018 at 11:03 pm to [facility] , who verbally acknowledged these results. Electronically Signed   By: Ulyses Jarred M.D.   On: 07/23/2018 23:04    Procedures Procedures (including critical care time)  Medications Ordered in ED Medications  clevidipine (CLEVIPREX) infusion 0.5 mg/mL (21 mg/hr Intravenous Rate/Dose Verify 07/23/18 2355)     Initial Impression / Assessment and Plan / ED Course  I have reviewed the triage vital signs and the nursing notes.  Pertinent labs & imaging  results that were available during my care of the patient were reviewed by me and considered in my medical decision making (see chart for details).     Patient seen and examined. Work-up initiated. Medications ordered.  No code stroke given his symptoms are resolved and no objective findings on exam.  Recommended brain MRI imaging.  This was ordered immediately as MRI is only in house until 10 PM and patient with no headache or objective findings on exam.  Patient discussed with and seen by Dr. Zenia Resides.  Vital signs reviewed and are as follows: BP (!) 193/120   Pulse 77   Temp 98.6 F (37 C) (Oral)   Resp (!) 26   Ht 5\' 10"  (1.778 m)   Wt 75.8 kg   SpO2 95%   BMI 23.96 kg/m   11:17 PM spoke with radiologist regarding MRI brain findings.  MRI brain shows question of hemorrhage versus infarct.  CT ordered to further delineate.  If it has the appearance of hemorrhage, patient will receive medication to control blood pressure.  This is been ordered.  11:50 PM CT confirms bleeding. Cleviprex titration ongoing. No call back yet from neurohospitalist.   11:57 PM Spoke with Dr. Lorraine Lax. Accepts patient to neuro ICU at [facility]. Bed request placed.   CRITICAL CARE Performed by: Carlisle Cater PA-C Total critical care time: 50 minutes Critical care time was exclusive of separately billable procedures and treating other patients. Critical care was necessary to treat or prevent imminent or life-threatening deterioration. Critical care was time spent personally by me on the following activities: development of treatment plan with patient and/or surrogate as well as nursing, discussions with consultants, evaluation of patient's response to treatment, examination of patient, obtaining history from patient or surrogate, ordering and performing treatments and interventions, ordering and review of laboratory studies, ordering and review of radiographic studies, pulse oximetry and re-evaluation of patient's  condition.   Final Clinical Impressions(s) / ED Diagnoses   Final diagnoses:  Left-sided nontraumatic intracerebral hemorrhage, unspecified cerebral location ([place])   Admit.   ED Discharge Orders    None       Carlisle Cater, Hershal Coria 07/23/18 2359    Lacretia Leigh, MD 07/27/18 [phone]

## 2018-07-23 NOTE — ED Provider Notes (Signed)
Medical screening examination/treatment/procedure(s) were conducted as a shared visit with non-physician practitioner(s) and myself.  I personally evaluated the patient during the encounter.  None  67 year old male presents with acute onset of dysarthria after sexual intercourse this evening.  Symptoms are gradually resolved.  Denies any headache or emesis.  Neurological exam here is nonfocal.  Concern patient may have had a TIA.  Brain MRI pending and patient will likely require hospitalization   Lacretia Leigh, MD 07/23/18 2147

## 2018-07-24 ENCOUNTER — Inpatient Hospital Stay (HOSPITAL_COMMUNITY): Payer: Medicare HMO

## 2018-07-24 DIAGNOSIS — Z8249 Family history of ischemic heart disease and other diseases of the circulatory system: Secondary | ICD-10-CM | POA: Diagnosis not present

## 2018-07-24 DIAGNOSIS — Z880 Allergy status to penicillin: Secondary | ICD-10-CM | POA: Diagnosis not present

## 2018-07-24 DIAGNOSIS — I161 Hypertensive emergency: Secondary | ICD-10-CM | POA: Diagnosis present

## 2018-07-24 DIAGNOSIS — I610 Nontraumatic intracerebral hemorrhage in hemisphere, subcortical: Secondary | ICD-10-CM | POA: Diagnosis present

## 2018-07-24 DIAGNOSIS — Z9089 Acquired absence of other organs: Secondary | ICD-10-CM | POA: Diagnosis not present

## 2018-07-24 DIAGNOSIS — J302 Other seasonal allergic rhinitis: Secondary | ICD-10-CM | POA: Diagnosis present

## 2018-07-24 DIAGNOSIS — E785 Hyperlipidemia, unspecified: Secondary | ICD-10-CM | POA: Diagnosis present

## 2018-07-24 DIAGNOSIS — Z808 Family history of malignant neoplasm of other organs or systems: Secondary | ICD-10-CM | POA: Diagnosis not present

## 2018-07-24 DIAGNOSIS — I77819 Aortic ectasia, unspecified site: Secondary | ICD-10-CM | POA: Diagnosis present

## 2018-07-24 DIAGNOSIS — I700 Atherosclerosis of aorta: Secondary | ICD-10-CM | POA: Diagnosis present

## 2018-07-24 DIAGNOSIS — I619 Nontraumatic intracerebral hemorrhage, unspecified: Secondary | ICD-10-CM | POA: Diagnosis present

## 2018-07-24 DIAGNOSIS — I370 Nonrheumatic pulmonary valve stenosis: Secondary | ICD-10-CM | POA: Diagnosis not present

## 2018-07-24 DIAGNOSIS — I10 Essential (primary) hypertension: Secondary | ICD-10-CM | POA: Diagnosis present

## 2018-07-24 DIAGNOSIS — Z87891 Personal history of nicotine dependence: Secondary | ICD-10-CM | POA: Diagnosis not present

## 2018-07-24 DIAGNOSIS — Z823 Family history of stroke: Secondary | ICD-10-CM | POA: Diagnosis not present

## 2018-07-24 DIAGNOSIS — E876 Hypokalemia: Secondary | ICD-10-CM | POA: Diagnosis present

## 2018-07-24 DIAGNOSIS — Z8673 Personal history of transient ischemic attack (TIA), and cerebral infarction without residual deficits: Secondary | ICD-10-CM | POA: Diagnosis present

## 2018-07-24 DIAGNOSIS — E559 Vitamin D deficiency, unspecified: Secondary | ICD-10-CM | POA: Diagnosis present

## 2018-07-24 DIAGNOSIS — R29701 NIHSS score 1: Secondary | ICD-10-CM | POA: Diagnosis present

## 2018-07-24 DIAGNOSIS — K5790 Diverticulosis of intestine, part unspecified, without perforation or abscess without bleeding: Secondary | ICD-10-CM | POA: Diagnosis present

## 2018-07-24 DIAGNOSIS — N400 Enlarged prostate without lower urinary tract symptoms: Secondary | ICD-10-CM | POA: Diagnosis present

## 2018-07-24 LAB — ECHOCARDIOGRAM COMPLETE
Height: 70 in
Weight: 2672 oz

## 2018-07-24 LAB — MRSA PCR SCREENING: MRSA by PCR: NEGATIVE

## 2018-07-24 LAB — HIV ANTIBODY (ROUTINE TESTING W REFLEX): HIV SCREEN 4TH GENERATION: NONREACTIVE

## 2018-07-24 MED ORDER — IOPAMIDOL (ISOVUE-370) INJECTION 76%
100.0000 mL | Freq: Once | INTRAVENOUS | Status: AC | PRN
  Administered 2018-07-24: 75 mL via INTRAVENOUS

## 2018-07-24 MED ORDER — CHOLECALCIFEROL 10 MCG (400 UNIT) PO TABS
400.0000 [IU] | ORAL_TABLET | Freq: Every day | ORAL | Status: DC
  Administered 2018-07-24: 400 [IU] via ORAL
  Filled 2018-07-24 (×2): qty 1

## 2018-07-24 MED ORDER — ACETAMINOPHEN 650 MG RE SUPP: 650.0000 mg | RECTAL | Status: DC | PRN

## 2018-07-24 MED ORDER — ACETAMINOPHEN 160 MG/5ML PO SOLN: 650.0000 mg | ORAL | Status: DC | PRN

## 2018-07-24 MED ORDER — ACETAMINOPHEN 325 MG PO TABS: 650.0000 mg | ORAL_TABLET | ORAL | Status: DC | PRN

## 2018-07-24 MED ORDER — POTASSIUM CHLORIDE CRYS ER 20 MEQ PO TBCR
40.0000 meq | EXTENDED_RELEASE_TABLET | Freq: Two times a day (BID) | ORAL | Status: AC
  Administered 2018-07-24 (×2): 40 meq via ORAL
  Filled 2018-07-24 (×2): qty 2

## 2018-07-24 MED ORDER — SENNOSIDES-DOCUSATE SODIUM 8.6-50 MG PO TABS
1.0000 | ORAL_TABLET | Freq: Two times a day (BID) | ORAL | Status: DC
  Administered 2018-07-24 (×2): 1 via ORAL
  Filled 2018-07-24 (×2): qty 1

## 2018-07-24 MED ORDER — STROKE: EARLY STAGES OF RECOVERY BOOK
Freq: Once | Status: AC
  Administered 2018-07-24: 1
  Filled 2018-07-24: qty 1

## 2018-07-24 MED ORDER — CARVEDILOL 3.125 MG PO TABS
6.2500 mg | ORAL_TABLET | Freq: Two times a day (BID) | ORAL | Status: DC
  Administered 2018-07-24: 6.25 mg via ORAL
  Filled 2018-07-24: qty 2

## 2018-07-24 MED ORDER — ENALAPRIL MALEATE 20 MG PO TABS
20.0000 mg | ORAL_TABLET | Freq: Every day | ORAL | Status: DC
  Administered 2018-07-24: 20 mg via ORAL
  Filled 2018-07-24: qty 1

## 2018-07-24 MED ORDER — VITAMIN C 500 MG PO TABS
1000.0000 mg | ORAL_TABLET | Freq: Every day | ORAL | Status: DC
  Administered 2018-07-24: 1000 mg via ORAL
  Filled 2018-07-24: qty 2

## 2018-07-24 MED ORDER — PANTOPRAZOLE SODIUM 40 MG IV SOLR
40.0000 mg | Freq: Every day | INTRAVENOUS | Status: DC
  Administered 2018-07-24: 40 mg via INTRAVENOUS
  Filled 2018-07-24: qty 40

## 2018-07-24 MED ORDER — AMLODIPINE BESYLATE 2.5 MG PO TABS
2.5000 mg | ORAL_TABLET | Freq: Two times a day (BID) | ORAL | Status: DC
  Administered 2018-07-24 (×2): 2.5 mg via ORAL
  Filled 2018-07-24 (×2): qty 1

## 2018-07-24 MED ORDER — IOPAMIDOL (ISOVUE-370) INJECTION 76%
INTRAVENOUS | Status: AC
  Filled 2018-07-24: qty 100

## 2018-07-24 NOTE — Evaluation (Addendum)
Occupational Therapy Evaluation and Discharge  Patient Details Name: [name] MRN: [id number] DOB: Mar 02, 1951 Today's Date: 07/24/2018    History of Present Illness 67 y.o. male with history of HTN, HLD presenting with sudden onset slurred speech while having sexual intercourse found to have Left BG hemorrhage in setting of elevated BP   Clinical Impression   PTA patient independent and teaching yoga.  Patient currently admitted for above and presents at baseline function, independent level for all self care and mobility.  Scored 0/28 on Short Blessed Test, normal cognition (brief screen).  Educated on BE-FAST.  Discussed importance of medication adherence, as patient initiated and verbalized he understands the importance now.  Emotionally labile during session.  No further OT needs identified.  OT signing off.      Follow Up Recommendations  No OT follow up    Equipment Recommendations  None recommended by OT    Recommendations for Other Services       Precautions / Restrictions Precautions Precaution Comments: watch BP Restrictions Weight Bearing Restrictions: No      Mobility Bed Mobility Overal bed mobility: Independent                Transfers Overall transfer level: Independent                    Balance Overall balance assessment: Independent                                         ADL either performed or assessed with clinical judgement   ADL Overall ADL's : At baseline;Modified independent                                       General ADL Comments: pt at baseline functional status for self care and transfers-- no assist needed     Vision Baseline Vision/History: Wears glasses Wears Glasses: Distance only Patient Visual Report: No change from baseline Vision Assessment?: Yes Eye Alignment: Within Functional Limits Ocular Range of Motion: Within Functional Limits Alignment/Gaze Preference: Within  Defined Limits Tracking/Visual Pursuits: Able to track stimulus in all quads without difficulty Saccades: Within functional limits Convergence: Within functional limits Visual Fields: No apparent deficits     Perception     Praxis      Pertinent Vitals/Pain Pain Assessment: No/denies pain     Hand Dominance Right   Extremity/Trunk Assessment Upper Extremity Assessment Upper Extremity Assessment: Overall WFL for tasks assessed   Lower Extremity Assessment Lower Extremity Assessment: Defer to PT evaluation   Cervical / Trunk Assessment Cervical / Trunk Assessment: Normal   Communication Communication Communication: No difficulties   Cognition Arousal/Alertness: Awake/alert Behavior During Therapy: WFL for tasks assessed/performed Overall Cognitive Status: Within Functional Limits for tasks assessed                                 General Comments: SBT completed: 0/28 scored= normal   General Comments  pt emotionally lablie during session; educated on BE FAST     Exercises     Shoulder Instructions      Home Living Family/patient expects to be discharged to:: Private residence Living Arrangements: (girlfriend comes over from time to time)   Type of Home:  House(townhouse) Home Access: Level entry     Home Layout: One level     Bathroom Shower/Tub: Occupational psychologist: Standard     Home Equipment: None          Prior Functioning/Environment Level of Independence: Independent        Comments: teaches yoga 5x/week, play golf          OT Problem List:        OT Treatment/Interventions:      OT Goals(Current goals can be found in the care plan section) Acute Rehab OT Goals Patient Stated Goal: to get home  OT Goal Formulation: With patient  OT Frequency:     Barriers to D/C:            Co-evaluation              AM-PAC OT "6 Clicks" Daily Activity     Outcome Measure Help from another person eating  meals?: None Help from another person taking care of personal grooming?: None Help from another person toileting, which includes using toliet, bedpan, or urinal?: None Help from another person bathing (including washing, rinsing, drying)?: None Help from another person to put on and taking off regular upper body clothing?: None Help from another person to put on and taking off regular lower body clothing?: None 6 Click Score: 24   End of Session Nurse Communication: Mobility status  Activity Tolerance: Patient tolerated treatment well Patient left: in bed;with call bell/phone within reach  OT Visit Diagnosis: Other abnormalities of gait and mobility (R26.89)                Time: 3500-9381 OT Time Calculation (min): 27 min Charges:  OT General Charges $OT Visit: 1 Visit OT Evaluation $OT Eval Low Complexity: 1 Low OT Treatments $Self Care/Home Management : 8-22 mins  Delight Stare, OT Acute Rehabilitation Services Pager [phone] Office [phone]   Delight Stare 07/24/2018, 4:31 PM

## 2018-07-24 NOTE — H&P (Signed)
Chief Complaint: slurred speech  History obtained from: Patient and Chart     HPI:                                                                                                                                       [name] is an 67 y.o. male past medical history of hypertension, hyperlipidemia who presented with sudden onset slurred speech while having sexual intercourse with his girlfriend on 7:30 PM last night.  Patient noticed his speech was slurred and presented to [facility] long emergency room.  MRI brain was performed which showed patient had a acute left basal ganglia hemorrhage.  His blood pressure was elevated in the 119E systolics and was started on Cleviprex and transferred to [facility] neuro ICU for further management.  On arrival, patient had mild dysarthria but no other deficits.  Blood pressure well controlled below 174 systolic.  Patient admitted that he forgot to take his morning blood pressure medication.  He denies taking any stimulants.   Date last known well: 11.26.19 Time last known well: 7:30 PM tPA Given: no, patient has hemorrhage   Intracerebral Hemorrhage (ICH) Score  Glascow Coma Score 3-15 0  Age >/= 80 yes no 0  ICH volume >/= 33ml  no 0  IVH  0  Infratentorial origin no 0 Total:  0   Past Medical History:  Diagnosis Date  . Diverticulosis   . Hyperlipidemia   . Hypertension   . Internal hemorrhoid   . Vitamin D deficiency     Past Surgical History:  Procedure Laterality Date  . INGUINAL HERNIA REPAIR    . NASAL SEPTUM SURGERY    . TONSILLECTOMY      Family History  Problem Relation Age of Onset  . Stroke Father   . Cancer Mother        Brain  . Heart attack Brother    Social History:  reports that he quit smoking about 46 years ago. He started smoking about 50 years ago. He has a 2.00 pack-year smoking history. He has never used smokeless tobacco. He reports that he does not drink alcohol or use drugs.  Allergies:   Allergies  Allergen Reactions  . Penicillins Other (See Comments)    Passes out Has patient had a PCN reaction causing immediate rash, facial/tongue/throat swelling, SOB or lightheadedness with hypotension: No Has patient had a PCN reaction causing severe rash involving mucus membranes or skin necrosis: No Has patient had a PCN reaction that required hospitalization: No Has patient had a PCN reaction occurring within the last 10 years: No If all of the above answers are "NO", then may proceed with Cephalosporin use.    Medications:  I reviewed home medications   ROS:                                                                                                                                     14 systems reviewed and negative except above    Examination:                                                                                                      General: Appears well-developed  Psych: Affect appropriate to situation Eyes: No scleral injection HENT: No OP obstrucion Head: Normocephalic.  Cardiovascular: Normal rate and regular rhythm.  Respiratory: Effort normal and breath sounds normal to anterior ascultation GI: Soft.  No distension. There is no tenderness.  Skin: WDI    Neurological Examination Mental Status: Alert, oriented, thought content appropriate.  Speech fluent without evidence of aphasia.  Mild dysarthria.  Able to follow 3 step commands without difficulty. Cranial Nerves: II: Visual fields grossly normal,  III,IV, VI: ptosis not present, extra-ocular motions intact bilaterally, pupils equal, round, reactive to light and accommodation V,VII: smile symmetric, facial light touch sensation normal bilaterally VIII: hearing normal bilaterally IX,X: uvula rises symmetrically XI: bilateral shoulder shrug XII: midline tongue  extension Motor: Right : Upper extremity   5/5    Left:     Upper extremity   5/5  Lower extremity   5/5     Lower extremity   5/5 Tone and bulk:normal tone throughout; no atrophy noted Sensory: Pinprick and light touch intact throughout, bilaterally Deep Tendon Reflexes: 2+ and symmetric throughout Plantars: Right: downgoing   Left: downgoing Cerebellar: normal finger-to-nose, normal rapid alternating movements and normal heel-to-shin test Gait: normal gait and station     Lab Results: Basic Metabolic Panel: Recent Labs  Lab 07/23/18 2127  NA 139  K 3.2*  CL 103  CO2 29  GLUCOSE 119*  BUN 14  CREATININE 1.09  CALCIUM 9.2    CBC: Recent Labs  Lab 07/23/18 2127  WBC 6.1  NEUTROABS 4.3  HGB 14.4  HCT 43.2  MCV 91.1  PLT 227    Coagulation Studies: Recent Labs    07/23/18 Dec 13, 2125  LABPROT 13.0  INR 0.99    Imaging: Ct Head Wo Contrast  Result Date: 07/23/2018 CLINICAL DATA:  Headache with slurred speech EXAM: CT HEAD WITHOUT CONTRAST TECHNIQUE: Contiguous axial images were obtained from the base of the skull through the vertex without intravenous contrast. COMPARISON:  MRI 07/23/2018 FINDINGS: Brain: Acute hemorrhage within  the left basal ganglia measuring 15 mm x 15 mm by 26 mm. No midline shift. Mild atrophy with small vessel ischemic changes of the white matter. Vascular: No hyperdense vessels.  Carotid vascular calcification Skull: Normal. Negative for fracture or focal lesion. Sinuses/Orbits: Mucosal thickening in the maxillary and ethmoid sinuses. Other: None IMPRESSION: Acute 15 x 15 x 26 mm hemorrhage within the left basal ganglia without midline shift or significant surrounding mass effect. Atrophy with mild small vessel ischemic changes of the white matter. Critical Value/emergent results were called by telephone at the time of interpretation on 07/23/2018 at 11:43 pm to Dr. Carlisle Cater , who verbally acknowledged these results. Electronically Signed   By:  Donavan Foil M.D.   On: 07/23/2018 23:43   Mr Brain Wo Contrast  Result Date: 07/23/2018 CLINICAL DATA:  Slurred speech and short-term memory loss. EXAM: MRI HEAD WITHOUT CONTRAST TECHNIQUE: Multiplanar, multiecho pulse sequences of the brain and surrounding structures were obtained without intravenous contrast. COMPARISON:  None. FINDINGS: BRAIN: There is abnormal diffusion restriction within the left basal ganglia. The midline structures are normal. No midline shift or other mass effect. Early confluent hyperintense T2-weighted signal of the periventricular and deep white matter, most commonly due to chronic ischemic microangiopathy. The cerebral and cerebellar volume are age-appropriate. There is peripheral magnetic susceptibility effect at the site of the diffusion abnormality. VASCULAR: Major intracranial arterial and venous sinus flow voids are normal. SKULL AND UPPER CERVICAL SPINE: Calvarial bone marrow signal is normal. There is no skull base mass. Visualized upper cervical spine and soft tissues are normal. SINUSES/ORBITS: No fluid levels or advanced mucosal thickening. No mastoid or middle ear effusion. The orbits are normal. IMPRESSION: 1. Abnormal diffusion characteristics and magnetic susceptibility effect within the left basal ganglia, concerning for acute intraparenchymal hemorrhage. Alternative consideration would be ischemic infarct with mild petechial hemorrhage. Head CT would provide clarification. 2. No hydrocephalus, herniation or other mass effect. 3. Chronic ischemic microangiopathy. Critical Value/emergent results were called by telephone at the time of interpretation on 07/23/2018 at 11:03 pm to [facility] , who verbally acknowledged these results. Electronically Signed   By: Ulyses Jarred M.D.   On: 07/23/2018 23:04     ASSESSMENT AND PLAN   67 y.o. male past medical history of hypertension, hyperlipidemia who presented with sudden onset slurred speech while having sexual  intercourse. Found to have left basal ganglia hemorrhage. NIHSS 1. ICH score 0  Left basal ganglia hemorrhage  - No Antiplatelets/AC - BP control less than 140/90 mmHg - started Cleveprix drip - close neuromonitoring, repeat CT head if change in exam  Hypertensive emergency - started Cleveprix - goal BP less than 140/90 mmHg  HLD - hold statin   Diet: regular Full code    Emi Lymon [facility] Pager Number [phone]

## 2018-07-24 NOTE — Evaluation (Addendum)
Physical Therapy Evaluation Patient Details Name: [name] MRN: [id number] DOB: Dec 04, 1950 Today's Date: 07/24/2018   History of Present Illness  67 y.o. male with history of HTN, HLD presenting with sudden onset slurred speech while having sexual intercourse found to have Left BG hemorrhage in setting of elevated BP  Clinical Impression  Patient seen for therapy assessment.  Mobilizing well with no noted focal deficits at this time.  No further acute PT needs. Will sign off.     Follow Up Recommendations No PT follow up    Equipment Recommendations  None recommended by PT    Recommendations for Other Services       Precautions / Restrictions Precautions Precaution Comments: watch BP      Mobility  Bed Mobility Overal bed mobility: Independent                Transfers Overall transfer level: Independent                  Ambulation/Gait Ambulation/Gait assistance: Independent Gait Distance (Feet): 410 Feet Assistive device: None Gait Pattern/deviations: WFL(Within Functional Limits)        Stairs            Wheelchair Mobility    Modified Rankin (Stroke Patients Only) Modified Rankin (Stroke Patients Only) Pre-Morbid Rankin Score: No symptoms Modified Rankin: No symptoms     Balance Overall balance assessment: Independent                               Standardized Balance Assessment Standardized Balance Assessment : Dynamic Gait Index   Dynamic Gait Index Level Surface: Normal Change in Gait Speed: Normal Gait with Horizontal Head Turns: Normal Gait with Vertical Head Turns: Normal Gait and Pivot Turn: Normal Step Over Obstacle: Moderate Impairment Step Around Obstacles: Normal       Pertinent Vitals/Pain Pain Assessment: No/denies pain    Home Living Family/patient expects to be discharged to:: Private residence Living Arrangements: Alone(Girlfriend comes over from time to time )   Type of Home:  House(townhouse) Home Access: Level entry     Home Layout: One level Home Equipment: None      Prior Function Level of Independence: Independent               Hand Dominance   Dominant Hand: Right    Extremity/Trunk Assessment   Upper Extremity Assessment Upper Extremity Assessment: Overall WFL for tasks assessed    Lower Extremity Assessment Lower Extremity Assessment: Overall WFL for tasks assessed       Communication   Communication: No difficulties  Cognition Arousal/Alertness: Awake/alert Behavior During Therapy: WFL for tasks assessed/performed Overall Cognitive Status: Within Functional Limits for tasks assessed                                        General Comments      Exercises     Assessment/Plan    PT Assessment Patent does not need any further PT services  PT Problem List         PT Treatment Interventions      PT Goals (Current goals can be found in the Care Plan section)  Acute Rehab PT Goals PT Goal Formulation: All assessment and education complete, DC therapy    Frequency     Barriers to discharge  Co-evaluation               AM-PAC PT "6 Clicks" Mobility  Outcome Measure Help needed turning from your back to your side while in a flat bed without using bedrails?: None Help needed moving from lying on your back to sitting on the side of a flat bed without using bedrails?: None Help needed moving to and from a bed to a chair (including a wheelchair)?: None Help needed standing up from a chair using your arms (e.g., wheelchair or bedside chair)?: None Help needed to walk in hospital room?: None Help needed climbing 3-5 steps with a railing? : None 6 Click Score: 24    End of Session   Activity Tolerance: Patient tolerated treatment well Patient left: in bed;with call bell/phone within reach Nurse Communication: Mobility status PT Visit Diagnosis: Other symptoms and signs involving the nervous  system (R29.898)    Time: 4403-4742 PT Time Calculation (min) (ACUTE ONLY): 14 min   Charges:   PT Evaluation $PT Eval Low Complexity: [place], PT DPT  Board Certified Neurologic Specialist Acute Rehabilitation Services Pager [phone] Office [phone]   Duncan Dull 07/24/2018, 11:28 AM

## 2018-07-24 NOTE — Progress Notes (Addendum)
STROKE TEAM PROGRESS NOTE   INTERVAL HISTORY No family is at the bedside.  Alert and oriented. Remains on cleviprex. Did miss 1 dose of BP meds yesterday.  Was not taking meds as originally prescribed.  Vitals:   07/24/18 0615 07/24/18 0630 07/24/18 0700 07/24/18 0800  BP: 119/67 116/74 118/69 109/66  Pulse: 64 66 70 60  Resp: 14 15 19 14   Temp:    98.5 F (36.9 C)  TempSrc:    Oral  SpO2: 96% 96% 93% 96%  Weight:      Height:        CBC:  Recent Labs  Lab 07/23/18 2127  WBC 6.1  NEUTROABS 4.3  HGB 14.4  HCT 43.2  MCV 91.1  PLT 478    Basic Metabolic Panel:  Recent Labs  Lab 07/23/18 2127  NA 139  K 3.2*  CL 103  CO2 29  GLUCOSE 119*  BUN 14  CREATININE 1.09  CALCIUM 9.2   Lipid Panel:     Component Value Date/Time   CHOL 181 11/12/2017 1138   TRIG 101.0 11/12/2017 1138   HDL 43.90 11/12/2017 1138   CHOLHDL 4 11/12/2017 1138   VLDL 20.2 11/12/2017 1138   LDLCALC 117 (H) 11/12/2017 1138   HgbA1c: No results found for: HGBA1C Urine Drug Screen:     Component Value Date/Time   LABOPIA NONE DETECTED 07/23/2018 2113   COCAINSCRNUR NONE DETECTED 07/23/2018 2113   LABBENZ NONE DETECTED 07/23/2018 2113   AMPHETMU NONE DETECTED 07/23/2018 2113   THCU NONE DETECTED 07/23/2018 2113   LABBARB NONE DETECTED 07/23/2018 2113    Alcohol Level     Component Value Date/Time   ETH <10 07/23/2018 2127    IMAGING Ct Head Wo Contrast  Result Date: 07/23/2018 CLINICAL DATA:  Headache with slurred speech EXAM: CT HEAD WITHOUT CONTRAST TECHNIQUE: Contiguous axial images were obtained from the base of the skull through the vertex without intravenous contrast. COMPARISON:  MRI 07/23/2018 FINDINGS: Brain: Acute hemorrhage within the left basal ganglia measuring 15 mm x 15 mm by 26 mm. No midline shift. Mild atrophy with small vessel ischemic changes of the white matter. Vascular: No hyperdense vessels.  Carotid vascular calcification Skull: Normal. Negative for fracture  or focal lesion. Sinuses/Orbits: Mucosal thickening in the maxillary and ethmoid sinuses. Other: None IMPRESSION: Acute 15 x 15 x 26 mm hemorrhage within the left basal ganglia without midline shift or significant surrounding mass effect. Atrophy with mild small vessel ischemic changes of the white matter. Critical Value/emergent results were called by telephone at the time of interpretation on 07/23/2018 at 11:43 pm to Dr. Carlisle Cater , who verbally acknowledged these results. Electronically Signed   By: Donavan Foil M.D.   On: 07/23/2018 23:43   Mr Brain Wo Contrast  Result Date: 07/23/2018 CLINICAL DATA:  Slurred speech and short-term memory loss. EXAM: MRI HEAD WITHOUT CONTRAST TECHNIQUE: Multiplanar, multiecho pulse sequences of the brain and surrounding structures were obtained without intravenous contrast. COMPARISON:  None. FINDINGS: BRAIN: There is abnormal diffusion restriction within the left basal ganglia. The midline structures are normal. No midline shift or other mass effect. Early confluent hyperintense T2-weighted signal of the periventricular and deep white matter, most commonly due to chronic ischemic microangiopathy. The cerebral and cerebellar volume are age-appropriate. There is peripheral magnetic susceptibility effect at the site of the diffusion abnormality. VASCULAR: Major intracranial arterial and venous sinus flow voids are normal. SKULL AND UPPER CERVICAL SPINE: Calvarial bone marrow signal is normal.  There is no skull base mass. Visualized upper cervical spine and soft tissues are normal. SINUSES/ORBITS: No fluid levels or advanced mucosal thickening. No mastoid or middle ear effusion. The orbits are normal. IMPRESSION: 1. Abnormal diffusion characteristics and magnetic susceptibility effect within the left basal ganglia, concerning for acute intraparenchymal hemorrhage. Alternative consideration would be ischemic infarct with mild petechial hemorrhage. Head CT would provide  clarification. 2. No hydrocephalus, herniation or other mass effect. 3. Chronic ischemic microangiopathy. Critical Value/emergent results were called by telephone at the time of interpretation on 07/23/2018 at 11:03 pm to [facility] , who verbally acknowledged these results. Electronically Signed   By: Ulyses Jarred M.D.   On: 07/23/2018 23:04    PHYSICAL EXAM Pleasant middle-age Caucasian male currently not in distress. . Afebrile. Head is nontraumatic. Neck is supple without bruit.    Cardiac exam no murmur or gallop. Lungs are clear to auscultation. Distal pulses are well felt. Neurological Exam ;  Awake  Alert oriented x 3. Normal speech and language except for mild dysarthria.eye movements full without nystagmus.fundi were not visualized. Vision acuity and fields appear normal. Hearing is normal. Palatal movements are normal. Face symmetric. Tongue midline. Normal strength, tone, reflexes and coordination. Normal sensation. Gait deferred.   ASSESSMENT/PLAN  [name] is a 67 y.o. male with history of HTN, HLD presenting with sudden onset slurred speech while having sexual intercourse..   Stroke:  Left BG hemorrhage in setting of elevated BP. Concern for underlying ischemic infarct, workup underway.  CT head L BG hemorrhage. Small vessel disease. Atrophy.    MRI  L BG hemorrhage vs ischemia w/ HT. Chronic ischemic microangiopathy  CTA head & neck pending   2D Echo  pending   LDL pending   HgbA1c pending   SCDs for VTE prophylaxis  No antithrombotic prior to admission, now on No antithrombotic.   Therapy recommendations:  pending   Disposition:  pending   Hypertensive Emergency  SBP > 200 on arrival  Missed 1 dose meds yesterday  Now on cleviprex  SBP goal < 140  Home meds:  norvasc 2.5 bid, coreg 6.25 bid, vasotec 20  Resume home meds  Wean cleviprex as able  Hyperlipidemia  Hx HLDS  Home meds:  None listed  LDL pending   Other Stroke Risk  Factors  Advanced age  Former Cigarette smoker, quit 46 years ago  Family hx stroke (father)  Other Active Problems  Hypokalemia 3.2 - supplement - recheck am   Hospital day # 0  Burnetta Sabin, MSN, APRN, ANVP-BC, AGPCNP-BC Advanced Practice Stroke Nurse [place] for Schedule & Pager information 07/24/2018 9:30 AM  I have personally examined this patient, reviewed notes, independently viewed imaging studies, participated in medical decision making and plan of care.ROS completed by me personally and pertinent positives fully documented  I have made any additions or clarifications directly to the above note. Agree with note above. Presented with speech difficulties with elevated blood pressure following sexual intercourse. CT scan MRI show hemorrhagic left basal ganglia infarct. Recommend close neurological monitoring and strict blood pressure control as per intracerebral hemorrhage protocol. Check CT angiogram of the brain and neck. Strict control of blood pressure. Resume home blood pressure medications. No family available at the bedside for discussion.This patient is critically ill and at significant risk of neurological worsening, death and care requires constant monitoring of vital signs, hemodynamics,respiratory and cardiac monitoring, extensive review of multiple databases, frequent neurological assessment, discussion  with family, other specialists and medical decision making of high complexity.I have made any additions or clarifications directly to the above note.This critical care time does not reflect procedure time, or teaching time or supervisory time of PA/NP/Med Resident etc but could involve care discussion time.  I spent 30 minutes of neurocritical care time  in the care of  this patient.     Antony Contras, MD Medical Director [facility] Pager: [phone] 07/24/2018 4:45 PM  To contact Stroke Continuity provider, please refer to  [email or web address] After hours, contact General Neurology

## 2018-07-24 NOTE — Progress Notes (Signed)
  Echocardiogram 2D Echocardiogram has been performed.  Jannett Celestine 07/24/2018, 2:00 PM

## 2018-07-25 LAB — LIPID PANEL
Cholesterol: 185 mg/dL (ref 0–200)
HDL: 41 mg/dL (ref >40)
LDL Cholesterol: 129 mg/dL — ABNORMAL HIGH (ref 0–99)
Total CHOL/HDL Ratio: 4.5 RATIO
Triglycerides: 76 mg/dL (ref <150)
VLDL: 15 mg/dL (ref 0–40)

## 2018-07-25 LAB — HEMOGLOBIN A1C
HEMOGLOBIN A1C: 4.9 % (ref 4.8–5.6)
MEAN PLASMA GLUCOSE: 93.93 mg/dL

## 2018-07-25 LAB — BASIC METABOLIC PANEL
ANION GAP: 7 (ref 5–15)
BUN: 9 mg/dL (ref 8–23)
CHLORIDE: 107 mmol/L (ref 98–111)
CO2: 24 mmol/L (ref 22–32)
CREATININE: 1.16 mg/dL (ref 0.61–1.24)
Calcium: 9.1 mg/dL (ref 8.9–10.3)
GFR calc non Af Amer: >60 mL/min (ref >60)
Glucose, Bld: 97 mg/dL (ref 70–99)
POTASSIUM: 3.5 mmol/L (ref 3.5–5.1)
SODIUM: 138 mmol/L (ref 135–145)

## 2018-07-25 MED ORDER — AMLODIPINE BESYLATE 5 MG PO TABS: 2.5000 mg | ORAL_TABLET | Freq: Two times a day (BID) | ORAL | 2 refills | Status: DC

## 2018-07-25 MED ORDER — ATORVASTATIN CALCIUM 10 MG PO TABS: 10.0000 mg | ORAL_TABLET | Freq: Every day | ORAL | 2 refills | Status: DC

## 2018-07-25 MED ORDER — ATORVASTATIN CALCIUM 10 MG PO TABS: 10.0000 mg | ORAL_TABLET | Freq: Every day | ORAL | Status: DC

## 2018-07-25 NOTE — Progress Notes (Signed)
Patient discharged to home.  All questions answered and discharge paperwork gone over with patient.  x2 IV's taken out.

## 2018-07-25 NOTE — Discharge Summary (Addendum)
Stroke Discharge Summary  Patient ID: [name]   MRN: [id number]      DOB: [birth year]  Date of Admission: 07/23/2018 Date of Discharge: 07/25/2018  Attending Physician:  Garvin Fila, MD, Stroke MD Consultant(s):    None  Patient's PCP:  [place], Betty G, MD  DISCHARGE DIAGNOSIS:  Principal Problem:   ICH (intracerebral hemorrhage) ([place])  Left BG hemorrhage, in setting of elevated BP. etiology unclear, hemorrhagic infarct vs hypertensive hemorrhage Active Problems:   Hyperlipidemia   Hypertension   Vitamin D deficiency   BPH (benign prostatic hyperplasia)   Seasonal allergies   Past Medical History:  Diagnosis Date  . Diverticulosis   . Hyperlipidemia   . Hypertension   . Internal hemorrhoid   . Vitamin D deficiency    Past Surgical History:  Procedure Laterality Date  . INGUINAL HERNIA REPAIR    . NASAL SEPTUM SURGERY    . TONSILLECTOMY      Allergies as of 07/25/2018      Reactions   Penicillins Other (See Comments)   Passes out Has patient had a PCN reaction causing immediate rash, facial/tongue/throat swelling, SOB or lightheadedness with hypotension: No Has patient had a PCN reaction causing severe rash involving mucus membranes or skin necrosis: No Has patient had a PCN reaction that required hospitalization: No Has patient had a PCN reaction occurring within the last 10 years: No If all of the above answers are "NO", then may proceed with Cephalosporin use.      Medication List    STOP taking these medications   losartan 50 MG tablet Commonly known as:  COZAAR     TAKE these medications   amLODipine 5 MG tablet Commonly known as:  NORVASC Take 0.5 tablets (2.5 mg total) by mouth 2 (two) times daily. What changed:  See the new instructions.   atorvastatin 10 MG tablet Commonly known as:  LIPITOR Take 1 tablet (10 mg total) by mouth daily at 6 PM.   carvedilol 6.25 MG tablet Commonly known as:  COREG TAKE 1 TABLET (6.25 MG TOTAL)  BY MOUTH 2 (TWO) TIMES DAILY WITH A MEAL.   enalapril 20 MG tablet Commonly known as:  VASOTEC Take 20 mg by mouth at bedtime.   loratadine 10 MG tablet Commonly known as:  CLARITIN Take 1 tablet (10 mg total) by mouth daily. What changed:    when to take this  reasons to take this   vitamin C 500 MG tablet Commonly known as:  ASCORBIC ACID Take 1,000 mg by mouth daily.   Vitamin D (Cholecalciferol) 10 MCG (400 UNIT) Caps Take 400 Units by mouth daily.   vitamin E 400 UNIT capsule Take 400 Units by mouth 2 (two) times a week.       LABORATORY STUDIES CBC    Component Value Date/Time   WBC 6.1 07/23/2018 2127   RBC 4.74 07/23/2018 2127   HGB 14.4 07/23/2018 2127   HCT 43.2 07/23/2018 2127   PLT 227 07/23/2018 2127   MCV 91.1 07/23/2018 2127   MCH 30.4 07/23/2018 2127   MCHC 33.3 07/23/2018 2127   RDW 13.2 07/23/2018 2127   LYMPHSABS 1.1 07/23/2018 2127   MONOABS 0.6 07/23/2018 2127   EOSABS 0.2 07/23/2018 2127   BASOSABS 0.0 07/23/2018 2127   CMP    Component Value Date/Time   NA 138 07/25/2018 0304   K 3.5 07/25/2018 0304   CL 107 07/25/2018 0304   CO2 24 07/25/2018  0304   GLUCOSE 97 07/25/2018 0304   BUN 9 07/25/2018 0304   CREATININE 1.16 07/25/2018 0304   CREATININE 0.88 08/19/2015 1042   CALCIUM 9.1 07/25/2018 0304   PROT 7.6 07/23/2018 2127   ALBUMIN 4.6 07/23/2018 2127   AST 21 07/23/2018 2127   ALT 17 07/23/2018 2127   ALKPHOS 71 07/23/2018 2127   BILITOT 1.0 07/23/2018 2127   GFRNONAA >60 07/25/2018 0304   GFRNONAA >89 08/19/2015 1042   GFRAA >60 07/25/2018 0304   GFRAA >89 08/19/2015 1042   COAGS Lab Results  Component Value Date   INR 0.99 07/23/2018   Lipid Panel    Component Value Date/Time   CHOL 185 07/25/2018 0304   TRIG 76 07/25/2018 0304   HDL 41 07/25/2018 0304   CHOLHDL 4.5 07/25/2018 0304   VLDL 15 07/25/2018 0304   LDLCALC 129 (H) 07/25/2018 0304   HgbA1C  Lab Results  Component Value Date   HGBA1C 4.9  07/25/2018   Urinalysis    Component Value Date/Time   COLORURINE STRAW (A) 07/23/2018 2113   APPEARANCEUR CLEAR 07/23/2018 2113   LABSPEC 1.006 07/23/2018 2113   PHURINE 8.0 07/23/2018 2113   GLUCOSEU NEGATIVE 07/23/2018 2113   HGBUR NEGATIVE 07/23/2018 2113   BILIRUBINUR NEGATIVE 07/23/2018 2113   KETONESUR NEGATIVE 07/23/2018 2113   PROTEINUR NEGATIVE 07/23/2018 2113   UROBILINOGEN 0.2 08/12/2014 1502   NITRITE NEGATIVE 07/23/2018 2113   LEUKOCYTESUR NEGATIVE 07/23/2018 2113   Urine Drug Screen     Component Value Date/Time   LABOPIA NONE DETECTED 07/23/2018 2113   COCAINSCRNUR NONE DETECTED 07/23/2018 2113   LABBENZ NONE DETECTED 07/23/2018 2113   AMPHETMU NONE DETECTED 07/23/2018 2113   THCU NONE DETECTED 07/23/2018 2113   LABBARB NONE DETECTED 07/23/2018 2113    Alcohol Level    Component Value Date/Time   ETH <10 07/23/2018 2127     SIGNIFICANT DIAGNOSTIC STUDIES Ct Angio Head W Or Wo Contrast  Result Date: 07/24/2018 CLINICAL DATA:  67 year old male with small left basal ganglia hemorrhage/hemorrhagic lacunar infarct after presentation with slurred speech and memory loss. EXAM: CT ANGIOGRAPHY HEAD AND NECK TECHNIQUE: Multidetector CT imaging of the head and neck was performed using the standard protocol during bolus administration of intravenous contrast. Multiplanar CT image reconstructions and MIPs were obtained to evaluate the vascular anatomy. Carotid stenosis measurements (when applicable) are obtained utilizing NASCET criteria, using the distal internal carotid diameter as the denominator. CONTRAST:  31mL ISOVUE-370 IOPAMIDOL (ISOVUE-370) INJECTION 76% COMPARISON:  Head CT and brain MRI 07/23/2018. FINDINGS: CT HEAD FINDINGS Brain: Globular area of hyperdense hemorrhage in the left lentiform encompassing 15 x 16 x 23 millimeters has not significantly changed since yesterday (estimated blood volume 3 milliliters). No intraventricular or extra-axial extension. Mild  surrounding edema is more apparent. No significant mass effect. Elsewhere Stable gray-white matter differentiation throughout the brain. No ventriculomegaly. No intracranial mass effect. No new intracranial hemorrhage. No cortically based acute infarct identified. Calvarium and skull base: Stable osteopenia. Paranasal sinuses: Stable bilateral mucosal thickening. Tympanic cavities and mastoids remain clear. Orbits: No acute orbit or scalp soft tissue findings. CTA NECK Skeleton: Degenerative changes in the cervical spine. No acute osseous abnormality identified. Upper chest: Negative. Other neck: Negative. Aortic arch: Calcified aortic atherosclerosis. 3 vessel arch configuration. Right carotid system: Tortuous brachiocephalic artery and proximal right CCA with mild plaque and no stenosis. Calcified plaque at the medial right ICA origin and bulb without stenosis. Left carotid system: No left CCA origin stenosis despite  mild plaque. Mildly tortuous left CCA. Confluent calcified plaque at the left carotid bifurcation, left ICA origin and posterior bulb, but less than 50 % stenosis with respect to the distal vessel. Both distal carotid arteries appear somewhat dolichoectatic. Vertebral arteries: No proximal right subclavian artery stenosis despite mild plaque. The right vertebral artery is non dominant with no origin or proximal stenosis. The right vertebral remains diminutive but patent to the skull base. No proximal left subclavian artery stenosis despite plaque. Left vertebral artery origin soft and calcified plaque resulting in mild to moderate stenosis as seen on series 14, image 117. Dominant and mildly tortuous left vertebral artery is patent to the skull base without additional stenosis. CTA HEAD Posterior circulation: Distal to the left PICA origin there is bulky calcified plaque in the left V4 segment with moderate stenosis (series 12, image 158 and series 13, image 139). There is right V4 segment calcified  plaque and stenosis of the diminutive proximal right V4 segment. The more distal right V4 and right PICA origin might be supplied in a retrograde fashion. Patent basilar artery with mild irregularity and no stenosis. Normal SCA and left PCA origins. Fetal type right PCA origin. Diminutive or absent left posterior communicating artery. Mild bilateral PCA irregularity. No PCA stenosis. Anterior circulation: Mild distal ICA dolichoectasia. Both ICA siphons are patent. Calcified plaque on the left results in mild to moderate supraclinoid stenosis (series 12, image 122 and series 14, image 131). On the right calcified plaque also results in mild to moderate supraclinoid stenosis. The right ophthalmic artery has a lateral origin (normal variant). Normal left ophthalmic and right posterior communicating artery origins. Patent carotid termini. Normal MCA and ACA origins. Anterior communicating artery and bilateral ACA branches are normal. Left MCA M1 segment, bifurcation, and left MCA branches are normal. Negative for CTA spot sign. Right MCA M1 segment is mildly irregular without stenosis. Right MCA trifurcation and right MCA branches are within normal limits. Venous sinuses: Patent, with some early contrast opacification of the right cavernous sinus suspected. Anatomic variants: Dominant left and diminutive right vertebral arteries. Fetal type right PCA origin. Delayed phase: No abnormal enhancement identified. Review of the MIP images confirms the above findings IMPRESSION: 1. Stable left lentiform nucleus hemorrhage with mild surrounding edema but no significant mass effect. Estimated blood volume 3 mL. No intraventricular or extra-axial extension. No new intracranial abnormality. 2. CTA is negative for large vessel occlusion, CTA spot sign, or intracranial aneurysm. 3. CTA is positive for atherosclerosis resulting in up to moderate stenosis of both intracranial ICAs (supraclinoid segments), the dominant Left Vertebral  Artery origin and the left V4 segment. 4. No cervical carotid stenosis despite plaque. 5. Aortic Atherosclerosis (ICD10-I70.0). Electronically Signed   By: Genevie Ann M.D.   On: 07/24/2018 12:54   Ct Head Wo Contrast  Result Date: 07/23/2018 CLINICAL DATA:  Headache with slurred speech EXAM: CT HEAD WITHOUT CONTRAST TECHNIQUE: Contiguous axial images were obtained from the base of the skull through the vertex without intravenous contrast. COMPARISON:  MRI 07/23/2018 FINDINGS: Brain: Acute hemorrhage within the left basal ganglia measuring 15 mm x 15 mm by 26 mm. No midline shift. Mild atrophy with small vessel ischemic changes of the white matter. Vascular: No hyperdense vessels.  Carotid vascular calcification Skull: Normal. Negative for fracture or focal lesion. Sinuses/Orbits: Mucosal thickening in the maxillary and ethmoid sinuses. Other: None IMPRESSION: Acute 15 x 15 x 26 mm hemorrhage within the left basal ganglia without midline shift or significant surrounding  mass effect. Atrophy with mild small vessel ischemic changes of the white matter. Critical Value/emergent results were called by telephone at the time of interpretation on 07/23/2018 at 11:43 pm to Dr. Carlisle Cater , who verbally acknowledged these results. Electronically Signed   By: Donavan Foil M.D.   On: 07/23/2018 23:43   Ct Angio Neck W Or Wo Contrast  Result Date: 07/24/2018 CLINICAL DATA:  67 year old male with small left basal ganglia hemorrhage/hemorrhagic lacunar infarct after presentation with slurred speech and memory loss. EXAM: CT ANGIOGRAPHY HEAD AND NECK TECHNIQUE: Multidetector CT imaging of the head and neck was performed using the standard protocol during bolus administration of intravenous contrast. Multiplanar CT image reconstructions and MIPs were obtained to evaluate the vascular anatomy. Carotid stenosis measurements (when applicable) are obtained utilizing NASCET criteria, using the distal internal carotid diameter  as the denominator. CONTRAST:  76mL ISOVUE-370 IOPAMIDOL (ISOVUE-370) INJECTION 76% COMPARISON:  Head CT and brain MRI 07/23/2018. FINDINGS: CT HEAD FINDINGS Brain: Globular area of hyperdense hemorrhage in the left lentiform encompassing 15 x 16 x 23 millimeters has not significantly changed since yesterday (estimated blood volume 3 milliliters). No intraventricular or extra-axial extension. Mild surrounding edema is more apparent. No significant mass effect. Elsewhere Stable gray-white matter differentiation throughout the brain. No ventriculomegaly. No intracranial mass effect. No new intracranial hemorrhage. No cortically based acute infarct identified. Calvarium and skull base: Stable osteopenia. Paranasal sinuses: Stable bilateral mucosal thickening. Tympanic cavities and mastoids remain clear. Orbits: No acute orbit or scalp soft tissue findings. CTA NECK Skeleton: Degenerative changes in the cervical spine. No acute osseous abnormality identified. Upper chest: Negative. Other neck: Negative. Aortic arch: Calcified aortic atherosclerosis. 3 vessel arch configuration. Right carotid system: Tortuous brachiocephalic artery and proximal right CCA with mild plaque and no stenosis. Calcified plaque at the medial right ICA origin and bulb without stenosis. Left carotid system: No left CCA origin stenosis despite mild plaque. Mildly tortuous left CCA. Confluent calcified plaque at the left carotid bifurcation, left ICA origin and posterior bulb, but less than 50 % stenosis with respect to the distal vessel. Both distal carotid arteries appear somewhat dolichoectatic. Vertebral arteries: No proximal right subclavian artery stenosis despite mild plaque. The right vertebral artery is non dominant with no origin or proximal stenosis. The right vertebral remains diminutive but patent to the skull base. No proximal left subclavian artery stenosis despite plaque. Left vertebral artery origin soft and calcified plaque  resulting in mild to moderate stenosis as seen on series 14, image 117. Dominant and mildly tortuous left vertebral artery is patent to the skull base without additional stenosis. CTA HEAD Posterior circulation: Distal to the left PICA origin there is bulky calcified plaque in the left V4 segment with moderate stenosis (series 12, image 158 and series 13, image 139). There is right V4 segment calcified plaque and stenosis of the diminutive proximal right V4 segment. The more distal right V4 and right PICA origin might be supplied in a retrograde fashion. Patent basilar artery with mild irregularity and no stenosis. Normal SCA and left PCA origins. Fetal type right PCA origin. Diminutive or absent left posterior communicating artery. Mild bilateral PCA irregularity. No PCA stenosis. Anterior circulation: Mild distal ICA dolichoectasia. Both ICA siphons are patent. Calcified plaque on the left results in mild to moderate supraclinoid stenosis (series 12, image 122 and series 14, image 131). On the right calcified plaque also results in mild to moderate supraclinoid stenosis. The right ophthalmic artery has a lateral origin (normal  variant). Normal left ophthalmic and right posterior communicating artery origins. Patent carotid termini. Normal MCA and ACA origins. Anterior communicating artery and bilateral ACA branches are normal. Left MCA M1 segment, bifurcation, and left MCA branches are normal. Negative for CTA spot sign. Right MCA M1 segment is mildly irregular without stenosis. Right MCA trifurcation and right MCA branches are within normal limits. Venous sinuses: Patent, with some early contrast opacification of the right cavernous sinus suspected. Anatomic variants: Dominant left and diminutive right vertebral arteries. Fetal type right PCA origin. Delayed phase: No abnormal enhancement identified. Review of the MIP images confirms the above findings IMPRESSION: 1. Stable left lentiform nucleus hemorrhage with  mild surrounding edema but no significant mass effect. Estimated blood volume 3 mL. No intraventricular or extra-axial extension. No new intracranial abnormality. 2. CTA is negative for large vessel occlusion, CTA spot sign, or intracranial aneurysm. 3. CTA is positive for atherosclerosis resulting in up to moderate stenosis of both intracranial ICAs (supraclinoid segments), the dominant Left Vertebral Artery origin and the left V4 segment. 4. No cervical carotid stenosis despite plaque. 5. Aortic Atherosclerosis (ICD10-I70.0). Electronically Signed   By: Genevie Ann M.D.   On: 07/24/2018 12:54   Mr Brain Wo Contrast  Result Date: 07/23/2018 CLINICAL DATA:  Slurred speech and short-term memory loss. EXAM: MRI HEAD WITHOUT CONTRAST TECHNIQUE: Multiplanar, multiecho pulse sequences of the brain and surrounding structures were obtained without intravenous contrast. COMPARISON:  None. FINDINGS: BRAIN: There is abnormal diffusion restriction within the left basal ganglia. The midline structures are normal. No midline shift or other mass effect. Early confluent hyperintense T2-weighted signal of the periventricular and deep white matter, most commonly due to chronic ischemic microangiopathy. The cerebral and cerebellar volume are age-appropriate. There is peripheral magnetic susceptibility effect at the site of the diffusion abnormality. VASCULAR: Major intracranial arterial and venous sinus flow voids are normal. SKULL AND UPPER CERVICAL SPINE: Calvarial bone marrow signal is normal. There is no skull base mass. Visualized upper cervical spine and soft tissues are normal. SINUSES/ORBITS: No fluid levels or advanced mucosal thickening. No mastoid or middle ear effusion. The orbits are normal. IMPRESSION: 1. Abnormal diffusion characteristics and magnetic susceptibility effect within the left basal ganglia, concerning for acute intraparenchymal hemorrhage. Alternative consideration would be ischemic infarct with mild  petechial hemorrhage. Head CT would provide clarification. 2. No hydrocephalus, herniation or other mass effect. 3. Chronic ischemic microangiopathy. Critical Value/emergent results were called by telephone at the time of interpretation on 07/23/2018 at 11:03 pm to [facility] , who verbally acknowledged these results. Electronically Signed   By: Ulyses Jarred M.D.   On: 07/23/2018 23:04    2D Echocardiogram  - Left ventricle: The cavity size was normal. There was moderate concentric hypertrophy. Systolic function was vigorous. The estimated ejection fraction was in the range of 65% to 70%. Wall motion was normal; there were no regional wall motion abnormalities. Doppler parameters are consistent with abnormal left ventricular relaxation (grade 1 diastolic dysfunction). - Aortic valve: There was no regurgitation. - Aorta: Ascending aorta maximal dimension: 46 mm. - Ascending aorta: The ascending aorta was moderately dilated. - Mitral valve: There was no significant regurgitation. - Left atrium: The atrium was mildly dilated. - Right ventricle: Systolic function was normal. - Tricuspid valve: There was trivial regurgitation. - Pulmonic valve: There was mild regurgitation. Impressions:  No cardiac source of emboli was indentified. Ascending aorta dilated at 4.6 cm.   HISTORY OF PRESENT ILLNESS [name] is an 67  y.o. male past medical history of hypertension, hyperlipidemia who presented with sudden onset slurred speech while having sexual intercourse with his girlfriend on 07/23/218 at 7:30 PM last night.  Patient noticed his speech was slurred and presented to [facility] long emergency room.  MRI brain was performed which showed patient had a acute left basal ganglia hemorrhage.  His blood pressure was elevated in the 440H systolics and was started on Cleviprex and transferred to [facility] neuro ICU for further management. On arrival, patient had mild dysarthria but no other deficits.  Blood  pressure well controlled below 474 systolic.  Patient admitted that he forgot to take his morning blood pressure medication.  He denies taking any stimulants. Intracerebral Hemorrhage (ICH) Score:  0   HOSPITAL COURSE [name] is a 67 y.o. male with history of HTN, HLD presenting with sudden onset slurred speech while having sexual intercourse.  Stroke:  Left BG hemorrhage, in setting of elevated BP. etiology unclear, hemorrhagic infarct vs hypertensive hemorrhage  CT head L BG hemorrhage. Small vessel disease. Atrophy.    MRI  L BG hemorrhage vs ischemia w/ HT. Chronic ischemic microangiopathy  CTA head & neck stable L LN hmg. Vol 53mL. No LVO. atherosclerosis mod stenosis B ICAs. Aortic atherosclerosis.  2D Echo  EF 65-70%. No source of embolus. Dilated ascending aorta 4.6 cm   LDL 129  HgbA1c 4.9  No antithrombotic prior to admission, now on No antithrombotic.   Therapy recommendations:  no therapy needs  Disposition:  return home  Hypertensive Emergency  SBP > 200 on arrival  Missed 1 dose meds day of admission  Treated with cleviprex in ICU  Home meds:  norvasc 2.5 bid, coreg 6.25 bid, vasotec 20  Resume home meds  Hyperlipidemia  Hx HLD  Home meds:  None   LDL 129  Add lipitor 10  Low fat diet  Continue statin at d/c  Other Stroke Risk Factors  Advanced age  Former Cigarette smoker, quit 47 years ago  Family hx stroke (father)  Other Active Problems Hypokalemia 3.2 - supplement - recheck am    DISCHARGE EXAM Blood pressure 121/73, pulse (!) 57, temperature 98.5 F (36.9 C), temperature source Oral, resp. rate 16, height 5\' 10"  (1.778 m), weight 75.8 kg, SpO2 93 %. Pleasant middle-age Caucasian male currently not in distress. . Afebrile. Head is nontraumatic. Neck is supple without bruit.    Cardiac exam no murmur or gallop. Lungs are clear to auscultation. Distal pulses are well felt. Neurological Exam  Awake  Alert oriented  x 3. Normal speech and language except for mild dysarthria.eye movements full without nystagmus.fundi were not visualized. Vision acuity and fields appear normal. Hearing is normal. Palatal movements are normal. Face symmetric. Tongue midline. Normal strength, tone, reflexes and coordination. Normal sensation. Gait deferred.   Discharge Diet   Regular diet, thin liquids  DISCHARGE PLAN  Disposition:  Return home  Due to hemorrhage and risk of bleeding, do not take aspirin, aspirin-containing medications, or ibuprofen products   Ongoing risk factor control by Primary Care Physician at time of discharge  Follow-up [place], Betty G, MD in 2 weeks.  Follow-up in [facility] in 4 weeks, office to schedule an appointment.   25 minutes were spent preparing discharge.  Burnetta Sabin, MSN, APRN, ANVP-BC, AGPCNP-BC Advanced Practice Stroke Nurse [place] for Schedule & Pager information 07/25/2018 8:57 AM   I have personally examined this patient, reviewed notes,  independently viewed imaging studies, participated in medical decision making and plan of care.ROS completed by me personally and pertinent positives fully documented  I have made any additions or clarifications directly to the above note. Agree with note above.   Antony Contras, MD Medical Director [facility] Pager: [phone] 07/25/2018 1:38 PM

## 2018-07-30 ENCOUNTER — Ambulatory Visit (INDEPENDENT_AMBULATORY_CARE_PROVIDER_SITE_OTHER): Payer: Medicare HMO | Admitting: Family Medicine

## 2018-07-30 ENCOUNTER — Encounter: Payer: Self-pay | Admitting: Family Medicine

## 2018-07-30 VITALS — BP 140/90 | HR 76 | Temp 98.2°F | Resp 12 | Ht 70.0 in | Wt 164.5 lb

## 2018-07-30 DIAGNOSIS — I610 Nontraumatic intracerebral hemorrhage in hemisphere, subcortical: Secondary | ICD-10-CM

## 2018-07-30 DIAGNOSIS — E785 Hyperlipidemia, unspecified: Secondary | ICD-10-CM | POA: Diagnosis not present

## 2018-07-30 DIAGNOSIS — I10 Essential (primary) hypertension: Secondary | ICD-10-CM | POA: Diagnosis not present

## 2018-07-30 NOTE — Progress Notes (Signed)
HPI:   [name] is a 67 y.o. male, who is here today to follow on recent hospitalization.  I do not see after hospital discharge nurse call.  He presented to the ER on 07/23/18 because sudden onset of slurred speech. People called it to his attention,he "did not sound right."He told me he was in yoga class when even happed but records indicate symptoms started while having sex intercourse. He drove himself to the ER,noted that speech was getting better. No focal deficit or headache.  Brain MRI showed acute left basal ganglia hemorrhage. BP upon admission was elevated with SBP in the low 200's.  He has been not complaint with meds and f/u visit.  Hypertension:  Currently on Amlodipine 5 mg daily ,Coreg 6.25 mg bid,and enalapril 20 mg daily. He is taking medications as instructed, no side effects reported.  He has not noted unusual headache, visual changes, exertional chest pain, dyspnea,  focal weakness, or edema.  Speech is back to his normal.  Lab Results  Component Value Date   CREATININE 1.16 07/25/2018   BUN 9 07/25/2018   NA 138 07/25/2018   K 3.5 07/25/2018   CL 107 07/25/2018   CO2 24 07/25/2018    Hyperlipidemia: Currently on Atorvastatin 10 mg daily. Following a low fat diet: Yes,started after hospital discharge.. States that he was eating hamburgers,eggnog,cakes/cookies several times per week.  He has not noted side effects with medication.  Lab Results  Component Value Date   CHOL 185 07/25/2018   HDL 41 07/25/2018   LDLCALC 129 (H) 07/25/2018   TRIG 76 07/25/2018   CHOLHDL 4.5 07/25/2018    He is asking if he can stop statin medication and trying to improve his diet. Also wonders for how long he needs to take all his antihypertensive meds.  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   Review of Systems  Constitutional: Negative for activity change, appetite change, fatigue  and fever.  HENT: Negative for nosebleeds, sore throat and trouble swallowing.   Eyes: Negative for redness and visual disturbance.  Respiratory: Negative for cough, shortness of breath and wheezing.   Cardiovascular: Negative for chest pain, palpitations and leg swelling.  Gastrointestinal: Negative for abdominal pain, nausea and vomiting.  Genitourinary: Negative for decreased urine volume and hematuria.  Musculoskeletal: Negative for gait problem and myalgias.  Neurological: Negative for seizures, syncope, weakness, numbness and headaches.  Psychiatric/Behavioral: Negative for confusion. The patient is nervous/anxious.       Current Outpatient Medications on File Prior to Visit  Medication Sig Dispense Refill  . amLODipine (NORVASC) 5 MG tablet Take 0.5 tablets (2.5 mg total) by mouth 2 (two) times daily. 30 tablet 2  . atorvastatin (LIPITOR) 10 MG tablet Take 1 tablet (10 mg total) by mouth daily at 6 PM. 30 tablet 2  . carvedilol (COREG) 6.25 MG tablet TAKE 1 TABLET (6.25 MG TOTAL) BY MOUTH 2 (TWO) TIMES DAILY WITH A MEAL. 180 tablet 3  . enalapril (VASOTEC) 20 MG tablet Take 20 mg by mouth at bedtime.     . vitamin C (ASCORBIC ACID) 500 MG tablet Take 1,000 mg by mouth daily.     . Vitamin D, Cholecalciferol, 10 MCG (400 UNIT) CAPS Take 400 Units by mouth daily.    . vitamin E 400 UNIT capsule Take 400 Units by mouth 2 (two) times a week.     . loratadine (CLARITIN)  10 MG tablet Take 1 tablet (10 mg total) by mouth daily. (Patient taking differently: Take 10 mg by mouth daily as needed for allergies. ) 90 tablet 1   No current facility-administered medications on file prior to visit.      Past Medical History:  Diagnosis Date  . Diverticulosis   . Hyperlipidemia   . Hypertension   . Internal hemorrhoid   . Vitamin D deficiency    Allergies  Allergen Reactions  . Penicillins Other (See Comments)    Passes out Has patient had a PCN reaction causing immediate rash,  facial/tongue/throat swelling, SOB or lightheadedness with hypotension: No Has patient had a PCN reaction causing severe rash involving mucus membranes or skin necrosis: No Has patient had a PCN reaction that required hospitalization: No Has patient had a PCN reaction occurring within the last 10 years: No If all of the above answers are "NO", then may proceed with Cephalosporin use.    Social History   Socioeconomic History  . Marital status: Single    Spouse name: Not on file  . Number of children: Not on file  . Years of education: Not on file  . Highest education level: Not on file  Occupational History  . Not on file  Social Needs  . Financial resource strain: Not on file  . Food insecurity:    Worry: Not on file    Inability: Not on file  . Transportation needs:    Medical: Not on file    Non-medical: Not on file  Tobacco Use  . Smoking status: Former Smoker    Packs/day: 0.50    Years: 4.00    Pack years: 2.00    Start date: 08/29/1967    Last attempt to quit: 08/29/1971    Years since quitting: 46.9  . Smokeless tobacco: Never Used  . Tobacco comment: discussed AAA and agreed to fup   Substance and Sexual Activity  . Alcohol use: No  . Drug use: No  . Sexual activity: Not on file  Lifestyle  . Physical activity:    Days per week: Not on file    Minutes per session: Not on file  . Stress: Not on file  Relationships  . Social connections:    Talks on phone: Not on file    Gets together: Not on file    Attends religious service: Not on file    Active member of club or organization: Not on file    Attends meetings of clubs or organizations: Not on file    Relationship status: Not on file  Other Topics Concern  . Not on file  Social History Narrative  . Not on file    Vitals:   07/30/18 1424  BP: 140/90  Pulse: 76  Resp: 12  Temp: 98.2 F (36.8 C)  SpO2: 99%   Body mass index is 23.6 kg/m.   Physical Exam  Nursing note and vitals  reviewed. Constitutional: He is oriented to person, place, and time. He appears well-developed and well-nourished. No distress.  HENT:  Head: Normocephalic and atraumatic.  Mouth/Throat: Oropharynx is clear and moist and mucous membranes are normal.  Eyes: Pupils are equal, round, and reactive to light. Conjunctivae are normal.  Cardiovascular: Normal rate and regular rhythm.  No murmur heard. Pulses:      Dorsalis pedis pulses are 2+ on the right side, and 2+ on the left side.  Respiratory: Effort normal and breath sounds normal. No respiratory distress.  GI: Soft. He  exhibits no mass. There is no hepatomegaly. There is no tenderness.  Musculoskeletal: He exhibits no edema.  Lymphadenopathy:    He has no cervical adenopathy.  Neurological: He is alert and oriented to person, place, and time. He has normal strength. No cranial nerve deficit. Gait normal.  Skin: Skin is warm. No rash noted. No erythema.  Psychiatric: He has a normal mood and affect. Cognition and memory are normal.  Well groomed, good eye contact.    ASSESSMENT AND PLAN:  Mr.Jefte was seen today for hospitalization follow-up.  Diagnoses and all orders for this visit:  Essential hypertension BP slightly above goal. Instructed to check BP at home. No changes in current management. Complications of poorly controlled HTN discussed. Low sat diet recommended. F/U in 4 weeks with BP readings.  Hyperlipidemia, unspecified hyperlipidemia type Benefits of statin meds discussed. Strongly recommend continuing Atorvastatin and following low fat diet.  Nontraumatic subcortical hemorrhage of left cerebral hemisphere (HCC) No evident focal deficit, speech back to his normal. Adequate HTN controlled and statin for secondary prevention. Keep f/u appt with necrologist.      Rana Adorno G. Martin[place], MD  [facility]. [place] office.

## 2018-07-30 NOTE — Patient Instructions (Signed)
A few things to remember from today's visit:   Essential hypertension  Hyperlipidemia, unspecified hyperlipidemia type  Nontraumatic subcortical hemorrhage of left cerebral hemisphere (HCC)  No changes in medications today. Monitor blood pressure at home periodically. Try to be consistent with a low fat diet. Please keep appointment with neurologist.  Please be sure medication list is accurate. If a new problem present, please set up appointment sooner than planned today.

## 2018-09-24 ENCOUNTER — Ambulatory Visit: Payer: Self-pay | Admitting: Adult Health

## 2018-09-26 ENCOUNTER — Encounter: Payer: Self-pay | Admitting: Adult Health

## 2018-09-26 ENCOUNTER — Ambulatory Visit: Payer: Medicare HMO | Admitting: Adult Health

## 2018-09-26 VITALS — BP 130/84 | HR 59 | Ht 70.0 in | Wt 163.4 lb

## 2018-09-26 DIAGNOSIS — I610 Nontraumatic intracerebral hemorrhage in hemisphere, subcortical: Secondary | ICD-10-CM | POA: Diagnosis not present

## 2018-09-26 DIAGNOSIS — E785 Hyperlipidemia, unspecified: Secondary | ICD-10-CM | POA: Diagnosis not present

## 2018-09-26 DIAGNOSIS — I10 Essential (primary) hypertension: Secondary | ICD-10-CM

## 2018-09-26 NOTE — Progress Notes (Signed)
[facility] [address] [place]. [place] [address] [phone]       OFFICE FOLLOW UP NOTE  [name] Date of Birth:  1951/05/06 Medical Record Number:  [id number]   Reason for Referral:  hospital stroke follow up  CHIEF COMPLAINT:  Chief Complaint  Patient presents with  . Hospitalization Follow-up    Np for stroke. Alone. Treatment room. No concerns at this time.     HPI: [name] is being seen today for initial visit in the office for left basal ganglia hemorrhage in setting of elevated BP with the etiology possibilities hemorrhagic infarct versus hypertensive hemorrhage on 07/23/2018. History obtained from patient and chart review. Reviewed all radiology images and labs personally.  [name] a 68 y.o.malewith history of Sharp, [name] presented to [facility] long ED with sudden onset slurred speech while having sexual intercourse.  MRI brain showed acute left basal ganglia hemorrhage with elevated SBP 200s.  He was started on Cleviprex and transferred to [facility] for further management.  CTA head and neck was negative for LVO but did show moderate stenosis bilateral ICAs and aortic arthrosclerosis.  2D echo showed an EF of 65 to 70% without cardiac source of this identified and dilated ascending aorta 4.6 cm.  LDL 129 and A1c 4.9.  Sharp stabilized during admission and resumed home medications.  Initiated atorvastatin for elevated LDL and HDL management.  Other stroke risk factors include advanced age, former tobacco use and family history of stroke (father).  He was not on antithrombotic PTA and was advised to avoid aspirin or aspirin containing products due to hemorrhage.  He was discharged home in stable condition without therapy needs.  He is being seen today for hospital follow-up.  He continues to do well from a neurological standpoint without any residual deficits.  He has returned back to all prior activities without complication.  He  has not started Lipitor at this time as many of his friends have told him that once you start a statin you could never come back off of it.  He does endorse drastically changing his diet along with exercising daily and is hopeful that he is able to lower his LDL on his own.  He also endorses weight loss of almost 10 pounds.  Blood pressure today satisfactory 130/84.  He does monitor at home and typical SBP 120 and 130.  Denies new or worsening stroke/TIA symptoms.    ROS:   14 system review of systems performed and negative with exception of ringing in ears  PMH:  Past Medical History:  Diagnosis Date  . Diverticulosis   . Hyperlipidemia   . Hypertension   . Internal hemorrhoid   . Vitamin D deficiency     PSH:  Past Surgical History:  Procedure Laterality Date  . INGUINAL HERNIA REPAIR    . NASAL SEPTUM SURGERY    . TONSILLECTOMY      Social History:  Social History   Socioeconomic History  . Marital status: Single    Spouse name: Not on file  . Number of children: Not on file  . Years of education: Not on file  . Highest education level: Not on file  Occupational History  . Not on file  Social Needs  . Financial resource strain: Not on file  . Food insecurity:    Worry: Not on file    Inability: Not on file  . Transportation needs:    Medical: Not on file  Non-medical: Not on file  Tobacco Use  . Smoking status: Former Smoker    Packs/day: 0.50    Years: 4.00    Pack years: 2.00    Start date: 08/29/1967    Last attempt to quit: 08/29/1971    Years since quitting: 47.1  . Smokeless tobacco: Never Used  . Tobacco comment: discussed AAA and agreed to fup   Substance and Sexual Activity  . Alcohol use: No  . Drug use: No  . Sexual activity: Not on file  Lifestyle  . Physical activity:    Days per week: Not on file    Minutes per session: Not on file  . Stress: Not on file  Relationships  . Social connections:    Talks on phone: Not on file    Gets  together: Not on file    Attends religious service: Not on file    Active member of club or organization: Not on file    Attends meetings of clubs or organizations: Not on file    Relationship status: Not on file  . Intimate partner violence:    Fear of current or ex partner: Not on file    Emotionally abused: Not on file    Physically abused: Not on file    Forced sexual activity: Not on file  Other Topics Concern  . Not on file  Social History Narrative  . Not on file    Family History:  Family History  Problem Relation Age of Onset  . Stroke Father   . Cancer Mother        Brain  . Heart attack Brother     Medications:   Current Outpatient Medications on File Prior to Visit  Medication Sig Dispense Refill  . amLODipine (NORVASC) 5 MG tablet Take 0.5 tablets (2.5 mg total) by mouth 2 (two) times daily. 30 tablet 2  . carvedilol (COREG) 6.25 MG tablet TAKE 1 TABLET (6.25 MG TOTAL) BY MOUTH 2 (TWO) TIMES DAILY WITH A MEAL. 180 tablet 3  . enalapril (VASOTEC) 20 MG tablet Take 20 mg by mouth at bedtime.     . vitamin C (ASCORBIC ACID) 500 MG tablet Take 1,000 mg by mouth daily.     . Vitamin D, Cholecalciferol, 10 MCG (400 UNIT) CAPS Take 400 Units by mouth daily.    . vitamin E 400 UNIT capsule Take 400 Units by mouth 2 (two) times a week.     Marland Kitchen atorvastatin (LIPITOR) 10 MG tablet Take 1 tablet (10 mg total) by mouth daily at 6 PM. (Patient not taking: Reported on 09/26/2018) 30 tablet 2  . loratadine (CLARITIN) 10 MG tablet Take 1 tablet (10 mg total) by mouth daily. (Patient taking differently: Take 10 mg by mouth daily as needed for allergies. ) 90 tablet 1   No current facility-administered medications on file prior to visit.     Allergies:   Allergies  Allergen Reactions  . Penicillins Other (See Comments)    Passes out Has patient had a PCN reaction causing immediate rash, facial/tongue/throat swelling, SOB or lightheadedness with hypotension: No Has patient had a  PCN reaction causing severe rash involving mucus membranes or skin necrosis: No Has patient had a PCN reaction that required hospitalization: No Has patient had a PCN reaction occurring within the last 10 years: No If all of the above answers are "NO", then may proceed with Cephalosporin use.     Physical Exam  Vitals:   09/26/18 1335  BP: 130/84  Pulse: (!) 59  Weight: 163 lb 6.4 oz (74.1 kg)  Height: 5\' 10"  (1.778 m)   Body mass index is 23.45 kg/m. No exam data present  General: well developed, well nourished, pleasant elderly Caucasian male, seated, in no evident distress Depression screen [facility] 2/9 09/26/2018  Decreased Interest 0  Down, Depressed, Hopeless 0  PHQ - 2 Score 0  Altered sleeping 0  Tired, decreased energy 0  Change in appetite 0  Feeling bad or failure about yourself  0  Trouble concentrating 0  Moving slowly or fidgety/restless 0  Suicidal thoughts 0  PHQ-9 Score 0   Head: head normocephalic and atraumatic.   Neck: supple with no carotid or supraclavicular bruits Cardiovascular: regular rate and rhythm, no murmurs Musculoskeletal: no deformity Skin:  no rash/petichiae Vascular:  Normal pulses all extremities  Neurologic Exam Mental Status: Awake and fully alert. Oriented to place and time. Recent and remote memory intact. Attention span, concentration and fund of knowledge appropriate. Mood and affect appropriate.  Cranial Nerves: Fundoscopic exam reveals sharp disc margins. Pupils equal, briskly reactive to light. Extraocular movements full without nystagmus. Visual fields full to confrontation. Hearing intact. Facial sensation intact. Face, tongue, palate moves normally and symmetrically.  Motor: Normal bulk and tone. Normal strength in all tested extremity muscles. Sensory.: intact to touch , pinprick , position and vibratory sensation.  Coordination: Rapid alternating movements normal in all extremities. Finger-to-nose and heel-to-shin performed  accurately bilaterally. Gait and Station: Arises from chair without difficulty. Stance is normal. Gait demonstrates normal stride length and balance. Able to heel, toe and tandem walk without difficulty.  Reflexes: 1+ and symmetric. Toes downgoing.    NIHSS  0 Modified Rankin  0   Diagnostic Data (Labs, Imaging, Testing)  CT HEAD WO CONTRAST 07/23/2018 IMPRESSION: Acute 15 x 15 x 26 mm hemorrhage within the left basal ganglia without midline shift or significant surrounding mass effect. Atrophy with mild small vessel ischemic changes of the white matter.  CT ANGIO HEAD W OR WO CONTRAST CT ANGIO NECK W OR WO CONTRAST 07/24/2018 IMPRESSION: 1. Stable left lentiform nucleus hemorrhage with mild surrounding edema but no significant mass effect. Estimated blood volume 3 mL. No intraventricular or extra-axial extension. No new intracranial abnormality. 2. CTA is negative for large vessel occlusion, CTA spot sign, or intracranial aneurysm. 3. CTA is positive for atherosclerosis resulting in up to moderate stenosis of both intracranial ICAs (supraclinoid segments), the dominant Left Vertebral Artery origin and the left V4 segment. 4. No cervical carotid stenosis despite plaque. 5. Aortic Atherosclerosis (ICD10-I70.0).  MR BRAIN WO CONTRAST 07/23/2018 IMPRESSION: 1. Abnormal diffusion characteristics and magnetic susceptibility effect within the left basal ganglia, concerning for acute intraparenchymal hemorrhage. Alternative consideration would be ischemic infarct with mild petechial hemorrhage. Head CT would provide clarification. 2. No hydrocephalus, herniation or other mass effect. 3. Chronic ischemic microangiopathy.  ECHOCARDIOGRAM 07/24/2018 Study Conclusions  - Left ventricle: The cavity size was normal. There was moderate   concentric hypertrophy. Systolic function was vigorous. The   estimated ejection fraction was in the range of 65% to 70%. Wall   motion was  normal; there were no regional wall motion   abnormalities. Doppler parameters are consistent with abnormal   left ventricular relaxation (grade 1 diastolic dysfunction). - Aortic valve: There was no regurgitation. - Aorta: Ascending aorta maximal dimension: 46 mm. - Ascending aorta: The ascending aorta was moderately dilated. - Mitral valve: There was no significant regurgitation. - Left atrium: The atrium was  mildly dilated. - Right ventricle: Systolic function was normal. - Tricuspid valve: There was trivial regurgitation. - Pulmonic valve: There was mild regurgitation.  Impressions:  - No cardiac source of emboli was indentified. Ascending aorta   dilated at 4.6 cm.    ASSESSMENT: LARKIN MORELOS is a 68 y.o. year old male here with left BG hemorrhage in setting of elevated BP on 07/23/2018 secondary to unclear etiology but possibility hemorrhagic infarct vs hypertensive hemorrhage. Vascular risk factors include Sharp and HLD.     PLAN:  1. Left BG hemorrhage: Continue atorvastatin for secondary stroke prevention.  Advised him to avoid aspirin or aspirin containing products due to hemorrhage.  Maintain strict control of hypertension with blood pressure goal below 130/90, diabetes with hemoglobin A1c goal below 6.5% and cholesterol with LDL cholesterol (bad cholesterol) goal below 70 mg/dL.  I also advised the patient to eat a healthy diet with plenty of whole grains, cereals, fruits and vegetables, exercise regularly with at least 30 minutes of continuous activity daily and maintain ideal body weight. 2. Sharp: Advised to continue current treatment regimen.  Today's BP 130/84.  Advised to continue to monitor at home along with continued follow-up with PCP for management 3. HLD: Advised to continue current treatment regimen along with continued follow-up with PCP for future prescribing and monitoring of lipid panel.  Lipid panel obtained at today's appointment and educated patient on  importance of statin therapy for secondary stroke prevention.  He is highly motivated to not start a statin therefore discussed omega-3 which he was more willing to take.  Advised him that we will discuss further treatment options after lab results obtained.   Follow up in 6 months or call earlier if needed   Greater than 50% of time during this 25 minute visit was spent on counseling, explanation of diagnosis of L BG hemorrhage, reviewing risk factor management of Sharp and HLD, planning of further management along with potential future management, and discussion with patient and family answering all questions.    Venancio Poisson, AGNP-BC  [facility] [address] [place], [place] [address]  Phone [phone] Fax [phone] Note: This document was prepared with digital dictation and possible smart phrase technology. Any transcriptional errors that result from this process are unintentional.

## 2018-09-26 NOTE — Progress Notes (Signed)
I agree with the above plan 

## 2018-09-26 NOTE — Patient Instructions (Signed)
Continue atorvastatin (Lipitor) for secondary stroke prevention  Avoid aspirin or aspirin containing products due to recent bleed  We will check cholesterol levels today and we will decide continued use of Lipitor - this is highly recommended to continue despite cholesterol levels but if you are highly motivated to discontinue use of statins, you can always try omega-3 fatty acids (fish oil).   Continue to follow up with PCP regarding cholesterol and blood pressure management   Continue to monitor blood pressure at home  Maintain strict control of hypertension with blood pressure goal below 130/90, diabetes with hemoglobin A1c goal below 6.5% and cholesterol with LDL cholesterol (bad cholesterol) goal below 70 mg/dL. I also advised the patient to eat a healthy diet with plenty of whole grains, cereals, fruits and vegetables, exercise regularly and maintain ideal body weight.  Followup in the future with me in 6 months or call earlier if needed       Thank you for coming to see [name] at [facility]. I hope we have been able to provide you high quality care today.  You may receive a patient satisfaction survey over the next few weeks. We would appreciate your feedback and comments so that we may continue to improve ourselves and the health of our patients.     Cholesterol Content in Foods Cholesterol is a waxy, fat-like substance that helps to carry fat in the blood. The body needs cholesterol in small amounts, but too much cholesterol can cause damage to the arteries and heart. Most people should eat less than 200 milligrams (mg) of cholesterol a day. Foods with cholesterol  Cholesterol is found in animal-based foods, such as meat, seafood, and dairy. Generally, low-fat dairy and lean meats have less cholesterol than full-fat dairy and fatty meats. The milligrams of cholesterol per serving (mg per serving) of common cholesterol-containing foods are listed below. Meat  and other proteins  Egg - one large whole egg has 186 mg.  Veal shank - 4 oz has 141 mg.  Lean ground [place] (93% lean) - 4 oz has 118 mg.  Fat-trimmed lamb loin - 4 oz has 106 mg.  Lean ground beef (90% lean) - 4 oz has 100 mg.  Lobster - 3.5 oz has 90 mg.  Pork loin chops - 4 oz has 86 mg.  Canned salmon - 3.5 oz has 83 mg.  Fat-trimmed beef top loin - 4 oz has 78 mg.  Frankfurter - 1 frank (3.5 oz) has 77 mg.  Crab - 3.5 oz has 71 mg.  Roasted chicken without skin, white meat - 4 oz has 66 mg.  Light bologna - 2 oz has 45 mg.  Deli-cut [place] - 2 oz has 31 mg.  Canned tuna - 3.5 oz has 31 mg.  Bacon - 1 oz has 29 mg.  Oysters and mussels (raw) - 3.5 oz has 25 mg.  Mackerel - 1 oz has 22 mg.  Trout - 1 oz has 20 mg.  Pork sausage - 1 link (1 oz) has 17 mg.  Salmon - 1 oz has 16 mg.  Tilapia - 1 oz has 14 mg. Dairy  Soft-serve ice cream -  cup (4 oz) has 103 mg.  Whole-milk yogurt - 1 cup (8 oz) has 29 mg.  Cheddar cheese - 1 oz has 28 mg.  American cheese - 1 oz has 28 mg.  Whole milk - 1 cup (8 oz) has 23 mg.  2% milk - 1 cup (8 oz) has  18 mg.  Cream cheese - 1 tablespoon (Tbsp) has 15 mg.  Cottage cheese -  cup (4 oz) has 14 mg.  Low-fat (1%) milk - 1 cup (8 oz) has 10 mg.  Sour cream - 1 Tbsp has 8.5 mg.  Low-fat yogurt - 1 cup (8 oz) has 8 mg.  Nonfat Greek yogurt - 1 cup (8 oz) has 7 mg.  Half-and-half cream - 1 Tbsp has 5 mg. Fats and oils  Cod liver oil - 1 tablespoon (Tbsp) has 82 mg.  Butter - 1 Tbsp has 15 mg.  Lard - 1 Tbsp has 14 mg.  Bacon grease - 1 Tbsp has 14 mg.  Mayonnaise - 1 Tbsp has 5-10 mg.  Margarine - 1 Tbsp has 3-10 mg. Exact amounts of cholesterol in these foods may vary depending on specific ingredients and brands. Foods without cholesterol Most plant-based foods do not have cholesterol unless you combine them with a food that has cholesterol. Foods without cholesterol include:  Grains and  cereals.  Vegetables.  Fruits.  Vegetable oils, such as olive, canola, and sunflower oil.  Legumes, such as peas, beans, and lentils.  Nuts and seeds.  Egg whites. Summary  The body needs cholesterol in small amounts, but too much cholesterol can cause damage to the arteries and heart.  Most people should eat less than 200 milligrams (mg) of cholesterol a day. This information is not intended to replace advice given to you by your health care provider. Make sure you discuss any questions you have with your health care provider. Document Released: 04/10/2017 Document Revised: 04/10/2017 Document Reviewed: 04/10/2017 Elsevier Interactive Patient Education  [employer].

## 2018-09-27 LAB — LIPID PANEL
Chol/HDL Ratio: 3.7 ratio (ref 0.0–5.0)
Cholesterol, Total: 166 mg/dL (ref 100–199)
HDL: 45 mg/dL (ref >39)
LDL Calculated: 101 mg/dL — ABNORMAL HIGH (ref 0–99)
Triglycerides: 99 mg/dL (ref 0–149)
VLDL Cholesterol Cal: 20 mg/dL (ref 5–40)

## 2018-09-30 ENCOUNTER — Telehealth: Payer: Self-pay | Admitting: *Deleted

## 2018-09-30 NOTE — Telephone Encounter (Signed)
-----   Message from Scott Poisson, NP sent at 09/27/2018  7:16 AM EST ----- Please notify patient that his LDL or bad cholesterol is at 101.  He has done a great job lowering this cholesterol on his own through diet and exercise but as it continues to be elevated, it is highly recommended for him to take his Lipitor as originally prescribed to assist with further lowering his bad cholesterol.

## 2018-09-30 NOTE — Telephone Encounter (Signed)
Called pt & LVM asking for call back.  

## 2018-10-01 NOTE — Telephone Encounter (Signed)
I spoke with the patient and discussed lab results from [facility] NP including the LDL level and why she highly recommends for pt to take the Atorvastatin 10 mg as prescribed. The patient stated that he has changed his diet for only 8 weeks and is optimistic that 8 weeks from now his LDL would be even lower. He would like to know what Janett Billow NP thinks about this.

## 2018-10-02 NOTE — Telephone Encounter (Addendum)
I called patient about Janett Billow NP recommendations of taking lipitor despite decrease levels. I explained that per Janett Billow NP notes below she highly recommends continue taking the statin due to him having a stroke and what the AHA recommends. I stated Janett Billow NP wants pt to follow up with his PCP for continue monitoring. Pt stated his blood pressure today was 116/73 and heart rate is 60.He has lost weight, does yoga, exercise, and is eating a healthier diet. He is glad his LDL decrease from 129 to 101 in two months. Pt stated he is not going to take the lipitor and is satisfied with the care of Dr.SEthi,and Janett Billow NP.  He wants to lower his LDL the natural way that was stated above.He wanted to schedule an appt with Janett Billow NP in 6 weeks for more blood work. I explained to pt Janett Billow NP highly recommend restarting the lipitor because of his stroke but ultimately its his decision. PT stated he is not your average 68 year old pt. He feels good with his plan. I recommend that no 6 week follow up is needed with Janett Billow NP now. I stated per Janett Billow NP he needs to contact his PCP for continue management and monitoring. PT will keep appt in July 2019.Pt verbalized understanding.

## 2018-10-02 NOTE — Telephone Encounter (Signed)
Left vm for patient to call back about JEssica NP recommendations.

## 2018-10-02 NOTE — Telephone Encounter (Signed)
Please advise patient that despite change in diet, he continues to have elevation in his cholesterol levels.  It is also recommended by the American Heart Association to be on a statin medication after a stroke for secondary stroke prevention.  It is recommended for patient to continue statin for secondary stroke prevention but ultimately this will be his decision.  He should continue to follow with his PCP for continued monitoring and management

## 2018-10-02 NOTE — Telephone Encounter (Signed)
Revised. 

## 2018-10-03 NOTE — Telephone Encounter (Signed)
Noted! Thank you

## 2018-10-10 ENCOUNTER — Other Ambulatory Visit: Payer: Self-pay | Admitting: Family Medicine

## 2018-10-21 ENCOUNTER — Other Ambulatory Visit: Payer: Self-pay | Admitting: Family Medicine

## 2018-10-21 DIAGNOSIS — I10 Essential (primary) hypertension: Secondary | ICD-10-CM

## 2018-10-21 MED ORDER — ENALAPRIL MALEATE 20 MG PO TABS: 20.0000 mg | ORAL_TABLET | Freq: Every day | ORAL | 2 refills | Status: DC

## 2018-10-21 NOTE — Telephone Encounter (Signed)
Copied from [place] [phone]. Topic: Quick Communication - Rx Refill/Question >> Oct 21, 2018  1:08 PM Mcneil, Ja-Kwan wrote: Medication: amLODipine (NORVASC) 5 MG tablet and enalapril (VASOTEC) 20 MG tablet  - Pt stated [employer] called and stated that the Rx for enalapril (VASOTEC) 20 MG tablet was not approved however the Rx shows it was sent to [facility] on 10/11/18.  Has the patient contacted their pharmacy? yes   Preferred Pharmacy (with phone number or street name): [place], [place] [phone] (Phone) [phone] (Fax)  Agent: Please be advised that RX refills may take up to 3 business days. We ask that you follow-up with your pharmacy.

## 2018-10-21 NOTE — Telephone Encounter (Signed)
Enalapril prescription resent to pharmacy. Amlodipine previously prescribed by a different provider. Pt has appt scheduled with Dr. Martinique on 10/22/18.

## 2018-10-22 ENCOUNTER — Ambulatory Visit (INDEPENDENT_AMBULATORY_CARE_PROVIDER_SITE_OTHER): Payer: Medicare HMO | Admitting: Family Medicine

## 2018-10-22 ENCOUNTER — Encounter: Payer: Self-pay | Admitting: Family Medicine

## 2018-10-22 VITALS — BP 120/76 | HR 65 | Temp 98.1°F | Resp 12 | Ht 70.0 in | Wt 160.5 lb

## 2018-10-22 DIAGNOSIS — I10 Essential (primary) hypertension: Secondary | ICD-10-CM

## 2018-10-22 DIAGNOSIS — Z8673 Personal history of transient ischemic attack (TIA), and cerebral infarction without residual deficits: Secondary | ICD-10-CM

## 2018-10-22 DIAGNOSIS — E785 Hyperlipidemia, unspecified: Secondary | ICD-10-CM

## 2018-10-22 MED ORDER — AMLODIPINE BESYLATE 5 MG PO TABS: 2.5000 mg | ORAL_TABLET | Freq: Two times a day (BID) | ORAL | 1 refills | Status: DC

## 2018-10-22 NOTE — Patient Instructions (Signed)
A few things to remember from today's visit:   Essential hypertension  Hyperlipidemia, unspecified hyperlipidemia type  No changes in medications today. Consider taking atorvastatin. Continue low fat diet and regular physical activity.  Please be sure medication list is accurate. If a new problem present, please set up appointment sooner than planned today.

## 2018-10-22 NOTE — Assessment & Plan Note (Signed)
We discussed a few benefits of statin medications. He would like to continue nonpharmacologic treatment for now. We will plan on lipid panel at next visit.

## 2018-10-22 NOTE — Assessment & Plan Note (Signed)
Adequately BP control. No changes in current management. Continue low-salt diet. Annual eye exam.

## 2018-10-22 NOTE — Assessment & Plan Note (Signed)
We discussed secondary prevention. Adequate BP control. He is not interested in statin medication. Strongly recommend avoiding NSAIDs and Aspirin.

## 2018-10-22 NOTE — Progress Notes (Signed)
HPI:   [name] is a 68 y.o. male, who is here today for chronic disease management  He was last seen on 07/30/2018 for hospital follow-up after suffering a hemorrhagic CVA. He has not noted new focal deficit.  Hypertension seems to be well controlled with carvedilol 6.25 mg twice daily, enalapril 20 mg daily, and amlodipine 2.5 mg twice daily. He is checking BP at home, 120s/70s. No side effects from medications. He is following low-salt diet.  He tells me that recently he got OTC ibuprofen in case fever or need for analgesic.    Lab Results  Component Value Date   CREATININE 1.16 07/25/2018   BUN 9 07/25/2018   NA 138 07/25/2018   K 3.5 07/25/2018   CL 107 07/25/2018   CO2 24 07/25/2018   Hyperlipidemia: He did not start atorvastatin 10 mg. Apparently it was not at the pharmacy when he went to pick up his medications, he is not sure.  He states that he has changed his diet dramatically. He is now cooking at home, he is not eating fast food,no red meat, decreased amount of carbs and sweets. He is also exercising regularly, biking a few times per week.  Lab Results  Component Value Date   CHOL 166 09/26/2018   HDL 45 09/26/2018   LDLCALC 101 (H) 09/26/2018   TRIG 99 09/26/2018   CHOLHDL 3.7 09/26/2018     Review of Systems  Constitutional: Negative for activity change, appetite change, fatigue and fever.  HENT: Negative for nosebleeds, sore throat and trouble swallowing.   Eyes: Negative for redness and visual disturbance.  Respiratory: Negative for cough, shortness of breath and wheezing.   Cardiovascular: Negative for chest pain, palpitations and leg swelling.  Gastrointestinal: Negative for abdominal pain, nausea and vomiting.  Genitourinary: Negative for decreased urine volume and hematuria.  Musculoskeletal: Negative for gait problem and myalgias.  Neurological: Negative for dizziness, weakness, numbness and headaches.    Current  Outpatient Medications on File Prior to Visit  Medication Sig Dispense Refill  . amLODipine (NORVASC) 5 MG tablet Take 0.5 tablets (2.5 mg total) by mouth 2 (two) times daily. 30 tablet 2  . carvedilol (COREG) 6.25 MG tablet TAKE 1 TABLET (6.25 MG TOTAL) BY MOUTH 2 (TWO) TIMES DAILY WITH A MEAL. 180 tablet 3  . enalapril (VASOTEC) 20 MG tablet Take 1 tablet (20 mg total) by mouth daily. 90 tablet 2  . vitamin C (ASCORBIC ACID) 500 MG tablet Take 1,000 mg by mouth daily.     . Vitamin D, Cholecalciferol, 10 MCG (400 UNIT) CAPS Take 400 Units by mouth daily.    . vitamin E 400 UNIT capsule Take 400 Units by mouth 2 (two) times a week.     Marland Kitchen atorvastatin (LIPITOR) 10 MG tablet Take 1 tablet (10 mg total) by mouth daily at 6 PM. (Patient not taking: Reported on 09/26/2018) 30 tablet 2  . loratadine (CLARITIN) 10 MG tablet Take 1 tablet (10 mg total) by mouth daily. (Patient taking differently: Take 10 mg by mouth daily as needed for allergies. ) 90 tablet 1   No current facility-administered medications on file prior to visit.      Past Medical History:  Diagnosis Date  . Diverticulosis   . Hyperlipidemia   . Hypertension   . Internal hemorrhoid   . Vitamin D deficiency    Allergies  Allergen Reactions  . Penicillins Other (See Comments)    Passes out Has  patient had a PCN reaction causing immediate rash, facial/tongue/throat swelling, SOB or lightheadedness with hypotension: No Has patient had a PCN reaction causing severe rash involving mucus membranes or skin necrosis: No Has patient had a PCN reaction that required hospitalization: No Has patient had a PCN reaction occurring within the last 10 years: No If all of the above answers are "NO", then may proceed with Cephalosporin use.    Social History   Socioeconomic History  . Marital status: Single    Spouse name: Not on file  . Number of children: Not on file  . Years of education: Not on file  . Highest education level: Not  on file  Occupational History  . Not on file  Social Needs  . Financial resource strain: Not on file  . Food insecurity:    Worry: Not on file    Inability: Not on file  . Transportation needs:    Medical: Not on file    Non-medical: Not on file  Tobacco Use  . Smoking status: Former Smoker    Packs/day: 0.50    Years: 4.00    Pack years: 2.00    Start date: 08/29/1967    Last attempt to quit: 08/29/1971    Years since quitting: 47.1  . Smokeless tobacco: Never Used  . Tobacco comment: discussed AAA and agreed to fup   Substance and Sexual Activity  . Alcohol use: No  . Drug use: No  . Sexual activity: Not on file  Lifestyle  . Physical activity:    Days per week: Not on file    Minutes per session: Not on file  . Stress: Not on file  Relationships  . Social connections:    Talks on phone: Not on file    Gets together: Not on file    Attends religious service: Not on file    Active member of club or organization: Not on file    Attends meetings of clubs or organizations: Not on file    Relationship status: Not on file  Other Topics Concern  . Not on file  Social History Narrative  . Not on file    Vitals:   10/22/18 1206  BP: 120/76  Pulse: 65  Resp: 12  Temp: 98.1 F (36.7 C)  SpO2: 97%   Body mass index is 23.03 kg/m.  Wt Readings from Last 3 Encounters:  10/22/18 160 lb 8 oz (72.8 kg)  09/26/18 163 lb 6.4 oz (74.1 kg)  07/30/18 164 lb 8 oz (74.6 kg)    Physical Exam  Nursing note reviewed. Constitutional: He is oriented to person, place, and time. He appears well-developed and well-nourished. No distress.  HENT:  Head: Normocephalic and atraumatic.  Mouth/Throat: Oropharynx is clear and moist and mucous membranes are normal.  Eyes: Pupils are equal, round, and reactive to light. Conjunctivae are normal.  Cardiovascular: Normal rate and regular rhythm.  No murmur heard. Pulses:      Dorsalis pedis pulses are 2+ on the right side and 2+ on the left  side.  Respiratory: Effort normal and breath sounds normal. No respiratory distress.  GI: Soft. He exhibits no mass. There is no hepatomegaly. There is no abdominal tenderness.  Musculoskeletal:        General: No edema.  Lymphadenopathy:    He has no cervical adenopathy.  Neurological: He is alert and oriented to person, place, and time. He has normal strength. No cranial nerve deficit. Gait normal.  Skin: Skin is warm. No  rash noted. No erythema.  Psychiatric: He has a normal mood and affect. Cognition and memory are normal.  Well groomed, good eye contact.     ASSESSMENT AND PLAN:   [name] was seen today for chronic disease management.  No orders of the defined types were placed in this encounter. He prefers to hold on blood work until his CPE.  History of hemorrhagic cerebrovascular accident (CVA) without residual deficits We discussed secondary prevention. Adequate BP control. He is not interested in statin medication. Strongly recommend avoiding NSAIDs and Aspirin.   Hyperlipidemia We discussed a few benefits of statin medications. He would like to continue nonpharmacologic treatment for now. We will plan on lipid panel at next visit.  Hypertension Adequately BP control. No changes in current management. Continue low-salt diet. Annual eye exam.   Return in about 3 months (around 01/20/2019) for CPE.      Chardonnay Holzmann G. Martinique, MD  [facility]. [place] office.

## 2018-11-04 NOTE — Progress Notes (Deleted)
Subjective:   [name] is a 68 y.o. male who presents for Medicare Annual/Subsequent preventive examination.  Review of Systems:  No ROS.  Medicare Wellness Visit. Additional risk factors are reflected in the social history.    Sleep patterns: {SX; SLEEP PATTERNS:18802::"feels rested on waking","does not get up to void","gets up *** times nightly to void","sleeps *** hours nightly"}.    Home Safety/Smoke Alarms: Feels safe in home. Smoke alarms in place.  Living environment; residence and Firearm Safety: {Rehab home environment / accessibility:30080::"no firearms","firearms stored safely"}. Seat Belt Safety/Bike Helmet: Wears seat belt.    Male:   CCS-     PSA-  Lab Results  Component Value Date   PSA 0.97 11/12/2017   PSA 1.14 08/16/2016   PSA 1.15 08/19/2015       Objective:    Vitals: There were no vitals taken for this visit.  There is no height or weight on file to calculate BMI.  Advanced Directives 07/24/2018 07/23/2018 11/02/2017 06/02/2016 03/05/2015  Does Patient Have a Medical Advance Directive? No No Yes Yes Yes  Type of Advance Directive - - - - Living will  Does patient want to make changes to medical advance directive? - - - - No - Patient declined  Copy of [place] in Chart? - - - No - copy requested No - copy requested  Would patient like information on creating a medical advance directive? No - Patient declined No - Patient declined - - -    Tobacco Social History   Tobacco Use  Smoking Status Former Smoker  . Packs/day: 0.50  . Years: 4.00  . Pack years: 2.00  . Start date: 08/29/1967  . Last attempt to quit: 08/29/1971  . Years since quitting: 47.2  Smokeless Tobacco Never Used  Tobacco Comment   discussed AAA and agreed to fup      Counseling given: Not Answered Comment: discussed AAA and agreed to fup    Past Medical History:  Diagnosis Date  . Diverticulosis   . Hyperlipidemia   . Hypertension   . Internal  hemorrhoid   . Vitamin D deficiency    Past Surgical History:  Procedure Laterality Date  . INGUINAL HERNIA REPAIR    . NASAL SEPTUM SURGERY    . TONSILLECTOMY     Family History  Problem Relation Age of Onset  . Stroke Father   . Cancer Mother        Brain  . Heart attack Brother    Social History   Socioeconomic History  . Marital status: Single    Spouse name: Not on file  . Number of children: Not on file  . Years of education: Not on file  . Highest education level: Not on file  Occupational History  . Not on file  Social Needs  . Financial resource strain: Not on file  . Food insecurity:    Worry: Not on file    Inability: Not on file  . Transportation needs:    Medical: Not on file    Non-medical: Not on file  Tobacco Use  . Smoking status: Former Smoker    Packs/day: 0.50    Years: 4.00    Pack years: 2.00    Start date: 08/29/1967    Last attempt to quit: 08/29/1971    Years since quitting: 47.2  . Smokeless tobacco: Never Used  . Tobacco comment: discussed AAA and agreed to fup   Substance and Sexual Activity  .  Alcohol use: No  . Drug use: No  . Sexual activity: Not on file  Lifestyle  . Physical activity:    Days per week: Not on file    Minutes per session: Not on file  . Stress: Not on file  Relationships  . Social connections:    Talks on phone: Not on file    Gets together: Not on file    Attends religious service: Not on file    Active member of club or organization: Not on file    Attends meetings of clubs or organizations: Not on file    Relationship status: Not on file  Other Topics Concern  . Not on file  Social History Narrative  . Not on file    Outpatient Encounter Medications as of 11/05/2018  Medication Sig  . amLODipine (NORVASC) 5 MG tablet Take 0.5 tablets (2.5 mg total) by mouth 2 (two) times daily.  Marland Kitchen atorvastatin (LIPITOR) 10 MG tablet Take 1 tablet (10 mg total) by mouth daily at 6 PM. (Patient not taking: Reported on  09/26/2018)  . carvedilol (COREG) 6.25 MG tablet TAKE 1 TABLET (6.25 MG TOTAL) BY MOUTH 2 (TWO) TIMES DAILY WITH A MEAL.  Marland Kitchen enalapril (VASOTEC) 20 MG tablet Take 1 tablet (20 mg total) by mouth daily.  Marland Kitchen loratadine (CLARITIN) 10 MG tablet Take 1 tablet (10 mg total) by mouth daily. (Patient taking differently: Take 10 mg by mouth daily as needed for allergies. )  . vitamin C (ASCORBIC ACID) 500 MG tablet Take 1,000 mg by mouth daily.   . Vitamin D, Cholecalciferol, 10 MCG (400 UNIT) CAPS Take 400 Units by mouth daily.  . vitamin E 400 UNIT capsule Take 400 Units by mouth 2 (two) times a week.    No facility-administered encounter medications on file as of 11/05/2018.     Activities of Daily Living In your present state of health, do you have any difficulty performing the following activities: 07/24/2018 11/12/2017  Hearing? Y N  Comment not the best, "becasue of music" -  Vision? N N  Difficulty concentrating or making decisions? N N  Walking or climbing stairs? N N  Dressing or bathing? N N  Doing errands, shopping? N N  Some recent data might be hidden    Patient Care Team: Martinique, Betty G, MD as PCP - General (Family Medicine) Ladene Artist, MD as Consulting Physician (Gastroenterology) Agapito Games (Optometry) Jarome Matin, MD as Consulting Physician (Dermatology)   Assessment:   This is a routine wellness examination for [name]. Physical assessment deferred to PCP.   Exercise Activities and Dietary recommendations   Diet (meal preparation, eat out, water intake, caffeinated beverages, dairy products, fruits and vegetables): {Desc; diets:16563} :      Goals    . Weight (lb) < 160 lb (72.6 kg)     Start playing golf more  Slow down on sodium Start eating a healthier diet  Lean and green; vegetables and fruits  Check out  online nutrition programs as GumSearch.nl and http://vang.com/; fit57me; Look for foods with "whole" wheat; bran; oatmeal etc Shot at the  farmer's markets in season for fresher choices  Watch for "hydrogenated" on the label of oils which are trans-fats.  Watch for "high fructose corn syrup" in snacks, yogurt or ketchup  Meats have less marbling; bright colored fruits and vegetables;  Canned; dump out liquid and wash vegetables. Be mindful of what we are eating  Portion control is essential to a health weight! Sit  down; take a break and enjoy your meal; take smaller bites; put the fork down between bites;  It takes 20 minutes to get full; so check in with your fullness cues and stop eating when you start to fill full              Fall Risk Fall Risk  11/02/2017 08/16/2016 03/05/2015  Falls in the past year? No No No     Depression Screen PHQ 2/9 Scores 09/26/2018 11/02/2017 08/16/2016 03/05/2015  PHQ - 2 Score 0 0 0 0  PHQ- 9 Score 0 - - -    Cognitive Function MMSE - Mini Mental State Exam 11/02/2017  Orientation to time (No Data)  Orientation to time comments Ad8 score 0         Immunization History  Administered Date(s) Administered  . Influenza-Unspecified 05/17/2014, 05/28/2017  . Td 11/15/2007    Qualifies for Shingles Vaccine?   Screening Tests Health Maintenance  Topic Date Due  . PNA vac Low Risk Adult (1 of 2 - PCV13) 03/29/2014  . COLONOSCOPY  11/27/2016  . TETANUS/TDAP  11/14/2017  . COLON CANCER SCREENING ANNUAL FOBT  01/28/2018  . INFLUENZA VACCINE  08/07/2019 (Originally 03/28/2018)  . Hepatitis C Screening  Completed        Plan:     I have personally reviewed and noted the following in the patient's chart:   . Medical and social history . Use of alcohol, tobacco or illicit drugs  . Current medications and supplements . Functional ability and status . Nutritional status . Physical activity . Advanced directives . List of other physicians . Hospitalizations, surgeries, and ER visits in previous 12 months . Vitals . Screenings to include cognitive, depression, and  falls . Referrals and appointments  In addition, I have reviewed and discussed with patient certain preventive protocols, quality metrics, and best practice recommendations. A written personalized care plan for preventive services as well as general preventive health recommendations were provided to patient.     Alphia Moh, RN  11/04/2018

## 2018-11-05 ENCOUNTER — Ambulatory Visit: Payer: Medicare HMO

## 2018-11-05 ENCOUNTER — Telehealth: Payer: Self-pay

## 2018-11-05 NOTE — Telephone Encounter (Signed)
Pt. no-showed for awv. Author phoned pt. to offer to reschedule awv. No answer. Author left detailed VM asking for return call to reschedule.

## 2018-11-07 ENCOUNTER — Other Ambulatory Visit: Payer: Self-pay | Admitting: Family Medicine

## 2018-11-14 ENCOUNTER — Ambulatory Visit: Payer: Self-pay | Admitting: Adult Health

## 2018-12-02 ENCOUNTER — Telehealth: Payer: Self-pay | Admitting: *Deleted

## 2018-12-02 NOTE — Telephone Encounter (Signed)
Left detailed message for patient. Patient advised to call the office with any questions or concerns.  Copied from [place] [id number]. Topic: General - Inquiry >> Nov 28, 2018 12:34 PM Ahmed Prima L wrote: Reason for CRM: patient would like to know should he go ahead and go to [facility] and get his pneumonia shot?

## 2018-12-30 IMAGING — MR MR HEAD W/O CM
10 series · 42 of 48 positions shown · non-contrast
Comparison: None.

CLINICAL DATA: Slurred speech and short-term memory loss.

EXAM:
MRI HEAD WITHOUT CONTRAST
TECHNIQUE: Multiplanar, multiecho pulse sequences of the brain and surrounding
structures were obtained without intravenous contrast.

[Series 3: T1 · sagittal · 5.0mm · 0.47mm/px · 3 of 24 slices shown]
[im 1/24]
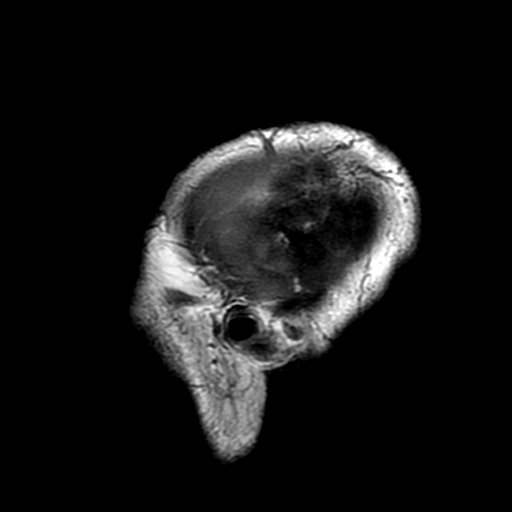
[im 12/24]
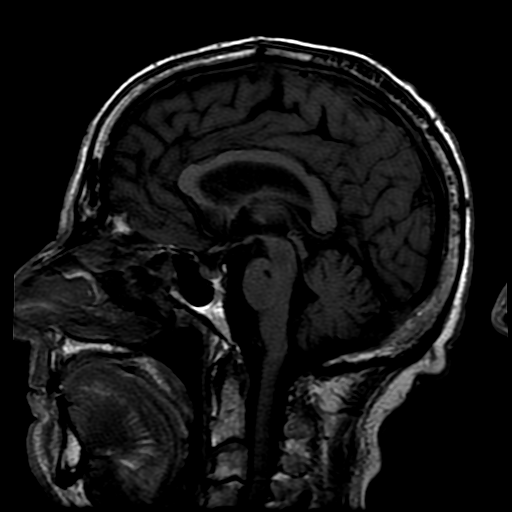
[im 24/24]
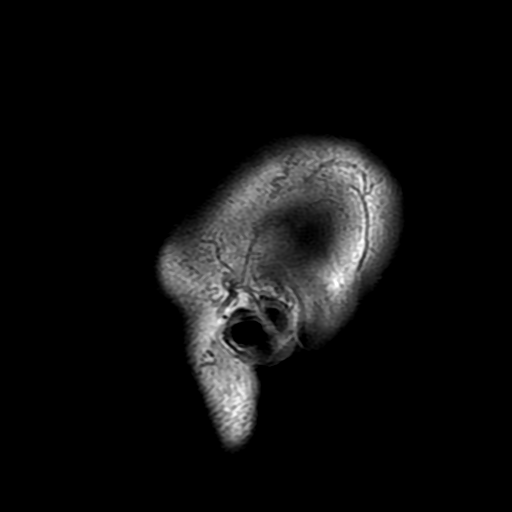

[Series 4: DWI · axial · 4.0mm · 1.17mm/px · z∈[-14,+132]mm · 8 of 68 slices shown (1 of 4)]
[im 1/68]
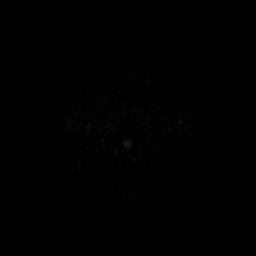
[im 10/68]
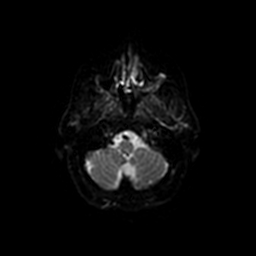
[im 20/68]
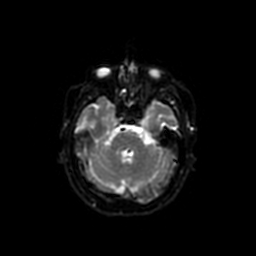
[im 29/68]
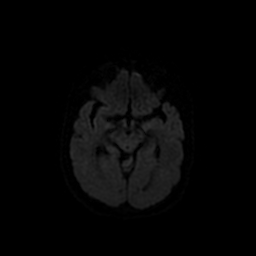
[im 39/68]
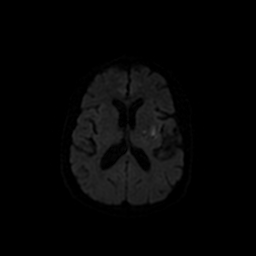
[im 48/68]
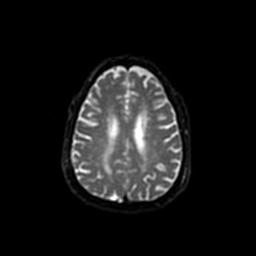
[im 58/68]
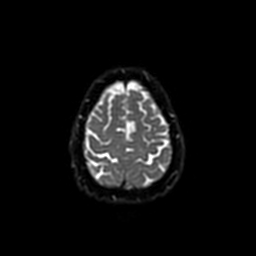
[im 68/68]
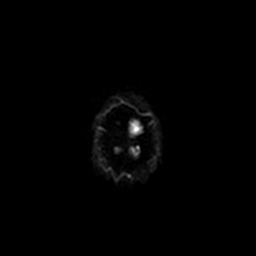

[Series 5: DWI · coronal · 4.0mm · 1.09mm/px · 8 of 86 slices shown (2 of 4)]
[im 1/86]
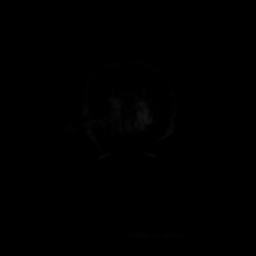
[im 10/86]
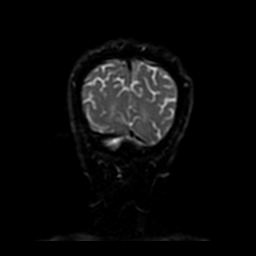
[im 29/86]
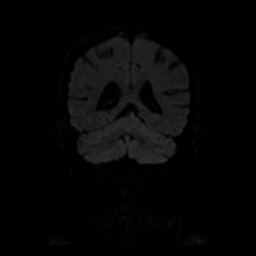
[im 38/86]
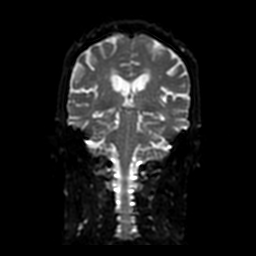
[im 48/86]
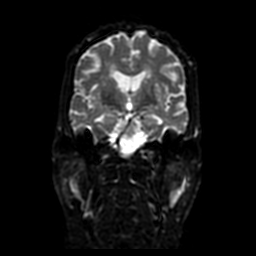
[im 57/86]
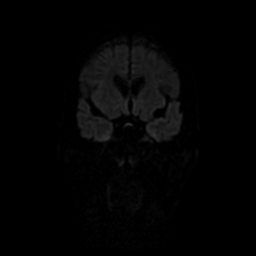
[im 76/86]
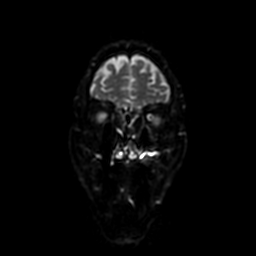
[im 86/86]
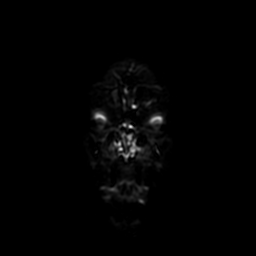

[Series 6: T2 · axial · 5.0mm · 0.86mm/px · z∈[-27,+124]mm · 3 of 23 slices shown (1 of 2)]
[im 1/23]
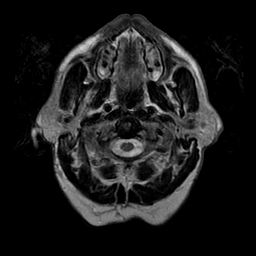
[im 12/23]
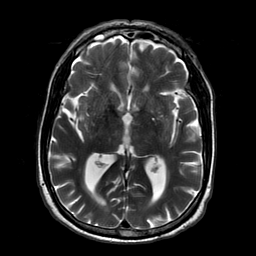
[im 23/23]
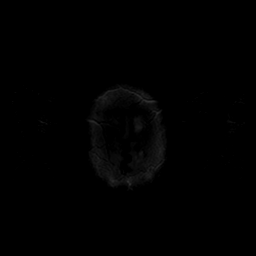

[Series 7: FLAIR · axial · 5.0mm · 0.86mm/px · z∈[-28,+125]mm · 3 of 27 slices shown]
[im 1/27]
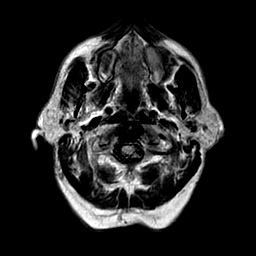
[im 14/27]
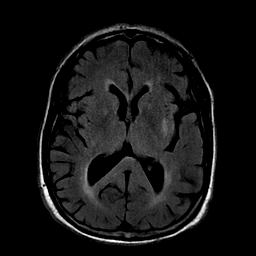
[im 27/27]
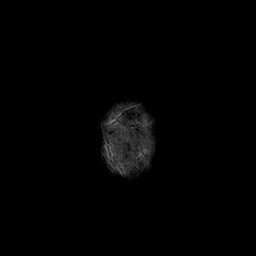

[Series 8: ax mpgr · axial · 5.0mm · 0.43mm/px · z∈[-41,+138]mm · 3 of 27 slices shown]
[im 1/27]
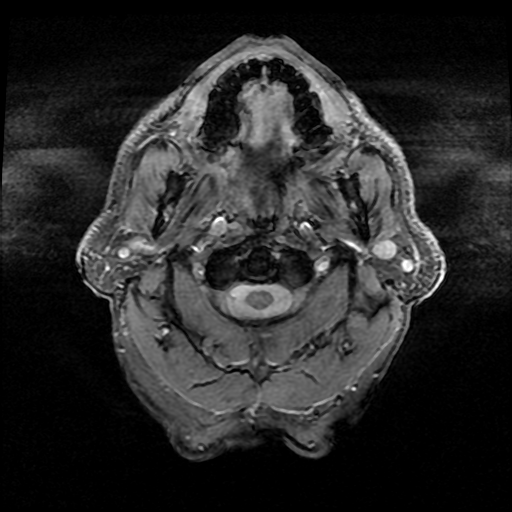
[im 14/27]
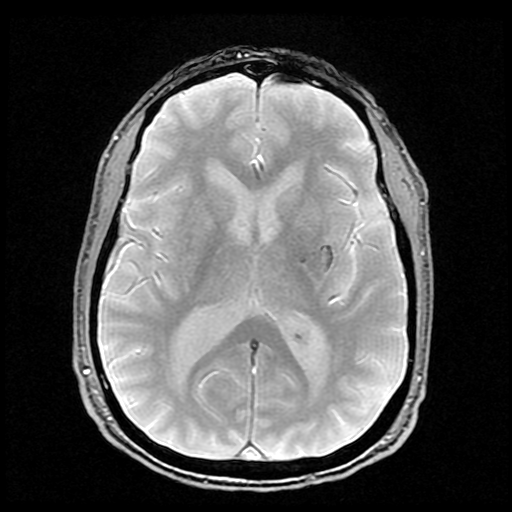
[im 27/27]
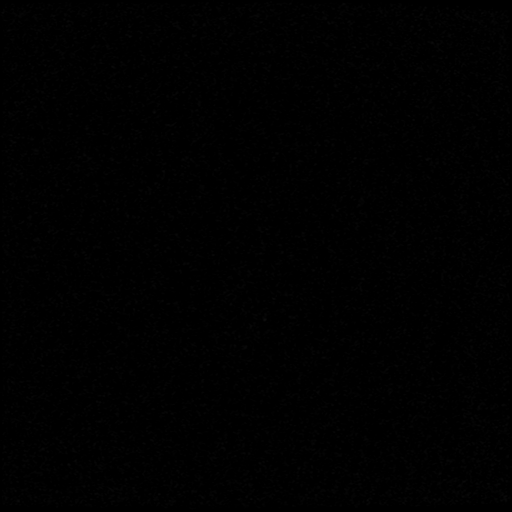

[Series 9: ax fspgr irp · axial · 3.0mm · 0.47mm/px · z∈[-24,+5]mm · 2 of 52 slices shown]
[im 1/52]
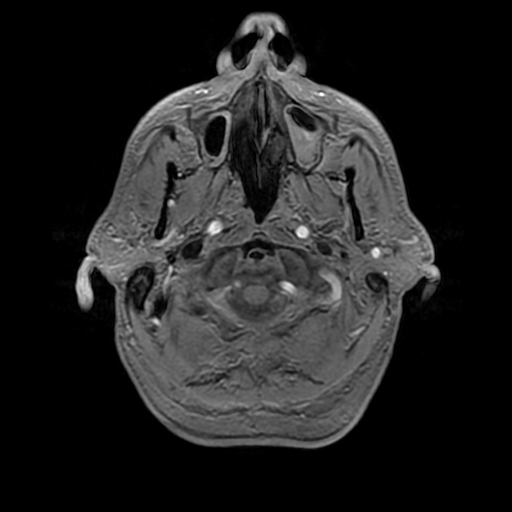
[im 11/52]
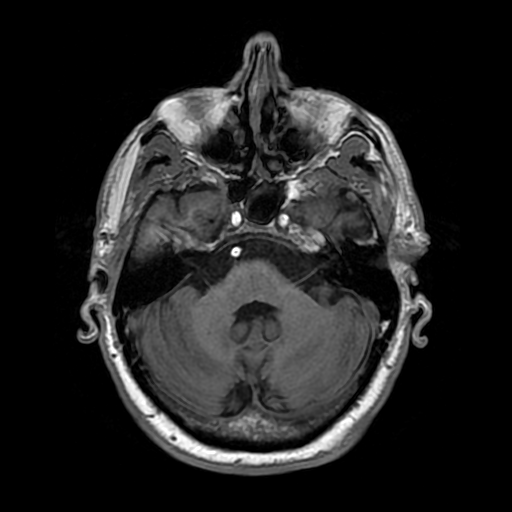

[Series 10: T2 · coronal · 5.0mm · 0.90mm/px · 3 of 27 slices shown (2 of 2)]
[im 1/27]
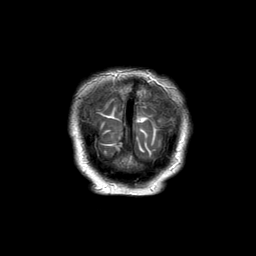
[im 14/27]
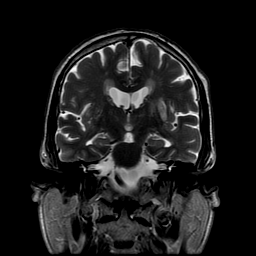
[im 27/27]
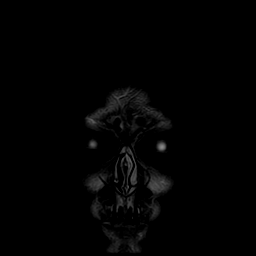

[Series 400: DWI · axial · 4.0mm · 1.17mm/px · z∈[-14,+132]mm · 4 of 34 slices shown (3 of 4)]
[im 1/34]
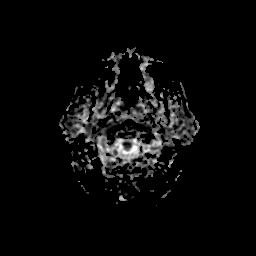
[im 12/34]
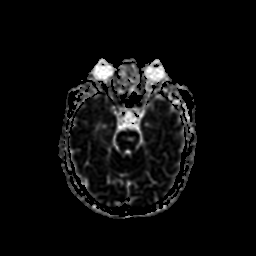
[im 23/34]
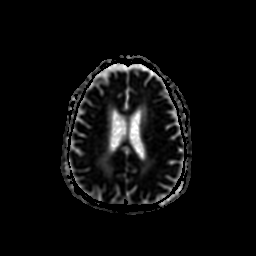
[im 34/34]
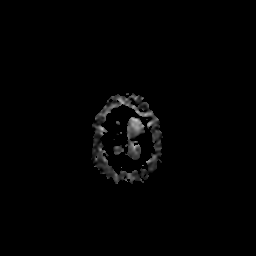

[Series 500: DWI · coronal · 4.0mm · 1.09mm/px · 5 of 43 slices shown (4 of 4)]
[im 1/43]
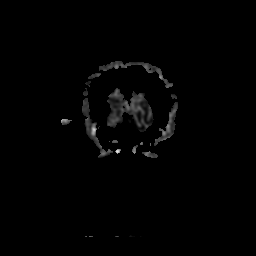
[im 11/43]
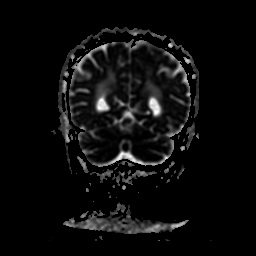
[im 22/43]
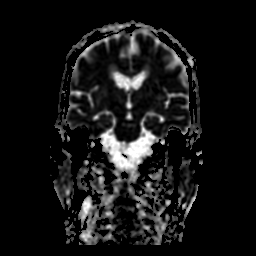
[im 32/43]
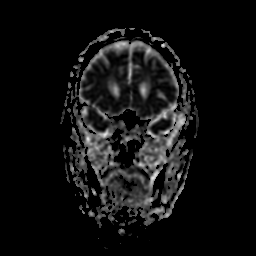
[im 43/43]
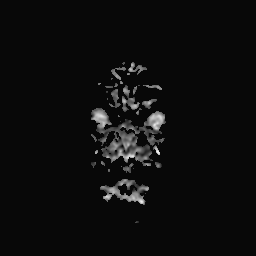

[42 of 48 positions shown; findings below may reference images not displayed]

FINDINGS: BRAIN: There is abnormal diffusion restriction within the left basal
ganglia. The midline structures are normal. No midline shift or
other mass effect. Early confluent hyperintense T2-weighted signal
of the periventricular and deep white matter, most commonly due to
chronic ischemic microangiopathy. The cerebral and cerebellar volume
are age-appropriate. There is peripheral magnetic susceptibility
effect at the site of the diffusion abnormality.

VASCULAR: Major intracranial arterial and venous sinus flow voids
are normal.

SKULL AND UPPER CERVICAL SPINE: Calvarial bone marrow signal is
normal. There is no skull base mass. Visualized upper cervical spine
and soft tissues are normal.

SINUSES/ORBITS: No fluid levels or advanced mucosal thickening. No
mastoid or middle ear effusion. The orbits are normal.
IMPRESSION: 1. Abnormal diffusion characteristics and magnetic susceptibility
effect within the left basal ganglia, concerning for acute
intraparenchymal hemorrhage. Alternative consideration would be
ischemic infarct with mild petechial hemorrhage. Head CT would
provide clarification.
2. No hydrocephalus, herniation or other mass effect.
3. Chronic ischemic microangiopathy.

Critical Value/emergent results were called by telephone at the time
of interpretation on 07/23/2018 at [DATE] to AZHE AK , who
verbally acknowledged these results.

## 2019-01-07 ENCOUNTER — Other Ambulatory Visit: Payer: Self-pay | Admitting: Family Medicine

## 2019-01-10 ENCOUNTER — Telehealth: Payer: Self-pay | Admitting: *Deleted

## 2019-01-10 ENCOUNTER — Other Ambulatory Visit: Payer: Self-pay

## 2019-01-10 ENCOUNTER — Ambulatory Visit (INDEPENDENT_AMBULATORY_CARE_PROVIDER_SITE_OTHER): Payer: Medicare HMO | Admitting: Family Medicine

## 2019-01-10 ENCOUNTER — Encounter: Payer: Self-pay | Admitting: Family Medicine

## 2019-01-10 VITALS — BP 124/82 | HR 64 | Temp 98.4°F | Resp 12 | Ht 70.0 in | Wt 160.2 lb

## 2019-01-10 DIAGNOSIS — H6123 Impacted cerumen, bilateral: Secondary | ICD-10-CM

## 2019-01-10 DIAGNOSIS — R42 Dizziness and giddiness: Secondary | ICD-10-CM | POA: Diagnosis not present

## 2019-01-10 DIAGNOSIS — I6529 Occlusion and stenosis of unspecified carotid artery: Secondary | ICD-10-CM

## 2019-01-10 DIAGNOSIS — Z8673 Personal history of transient ischemic attack (TIA), and cerebral infarction without residual deficits: Secondary | ICD-10-CM | POA: Diagnosis not present

## 2019-01-10 NOTE — Assessment & Plan Note (Addendum)
We went through carotid Dupplex results, he has a few questions about report,all answered to the best of my knowledge. Strongly recommend resuming Atorvastatin 10 mg,benefits discussed.  We will repeat carotid US. Instructed about warning signs.

## 2019-01-10 NOTE — Assessment & Plan Note (Addendum)
No residual focal deficit. Adequate BP controlled.  Also recommend resuming Atorvastatin,we discussed benefits.

## 2019-01-10 NOTE — Progress Notes (Signed)
HPI:  Chief Complaint  Patient presents with  . Bump on left side of throat    concerned about clogged arteries    [name] is a 68 y.o. male, who is here today complaining of "little bump" and dizziness noted when he applies pressure on left side. Dizziness last a few seconds. He denies headache, visual changes, numbness, tingling, dysphagia, sore throat, oral lesions, nausea, or vomiting.  He has had similar concerns in the past. 07/23/18 hospitalized because sudden onset of slurred speech. Brain MRI showed acute left basal ganglia hemorrhea.  Carotid duplex in 10/2017: Final Interpretation:Right Carotid: Velocities in the right ICA are consistent with a 1-39% stenosis. Non-hemodynamically significant plaque <50% noted in the CCA. Left Carotid: Velocities in the left ICA are consistent with a 1-39% stenosis. Non-hemodynamically significant plaque noted in the CCA.  Vertebrals:  Bilateral vertebral arteries demonstrate antegrade flow.Subclavians: Normal flow hemodynamics were seen in bilateral subclavian arteries.   He stopped taking atorvastatin 10 mg. Negative for side effects. He is following low fat diet.   Lab Results  Component Value Date   CHOL 166 09/26/2018   HDL 45 09/26/2018   LDLCALC 101 (H) 09/26/2018   TRIG 99 09/26/2018   CHOLHDL 3.7 09/26/2018   He has been compliant with antihypertensive medications.  He is also complaining about left ear being "stopped up" after he put water in ear while showering a few days ago. Problem is constant. He has not identified exacerbating or alleviating factors. It seems to be stable. He denies using Q-tips. Negative for ear ache, ear drainage, or tinnitus.  Review of Systems  Constitutional: Negative for activity change, appetite change, fatigue and fever.  HENT: Negative for facial swelling, mouth sores, sore throat and trouble swallowing.   Eyes: Negative for redness and visual disturbance.   Respiratory: Negative for cough, shortness of breath and wheezing.   Cardiovascular: Negative for chest pain, palpitations and leg swelling.  Gastrointestinal: Negative for abdominal pain, nausea and vomiting.  Neurological: Positive for dizziness. Negative for weakness, numbness and headaches.  Psychiatric/Behavioral: Negative for confusion. The patient is nervous/anxious.     Current Outpatient Medications on File Prior to Visit  Medication Sig Dispense Refill  . amLODipine (NORVASC) 5 MG tablet Take 0.5 tablets (2.5 mg total) by mouth 2 (two) times daily. 180 tablet 1  . atorvastatin (LIPITOR) 10 MG tablet Take 1 tablet (10 mg total) by mouth daily at 6 PM. 30 tablet 2  . carvedilol (COREG) 6.25 MG tablet TAKE 1 TABLET TWICE DAILY WITH A MEAL (NEED TO SCHEDULE PHYSICAL EXAM) 180 tablet 0  . enalapril (VASOTEC) 20 MG tablet Take 1 tablet (20 mg total) by mouth daily. 90 tablet 2  . vitamin C (ASCORBIC ACID) 500 MG tablet Take 1,000 mg by mouth daily.     . Vitamin D, Cholecalciferol, 10 MCG (400 UNIT) CAPS Take 400 Units by mouth daily.    . vitamin E 400 UNIT capsule Take 400 Units by mouth 2 (two) times a week.     . loratadine (CLARITIN) 10 MG tablet Take 1 tablet (10 mg total) by mouth daily. (Patient taking differently: Take 10 mg by mouth daily as needed for allergies. ) 90 tablet 1   No current facility-administered medications on file prior to visit.      Past Medical History:  Diagnosis Date  . Diverticulosis   . Hyperlipidemia   . Hypertension   . Internal hemorrhoid   .  Vitamin D deficiency    Allergies  Allergen Reactions  . Penicillins Other (See Comments)    Passes out Has patient had a PCN reaction causing immediate rash, facial/tongue/throat swelling, SOB or lightheadedness with hypotension: No Has patient had a PCN reaction causing severe rash involving mucus membranes or skin necrosis: No Has patient had a PCN reaction that required hospitalization: No Has  patient had a PCN reaction occurring within the last 10 years: No If all of the above answers are "NO", then may proceed with Cephalosporin use.    Social History   Socioeconomic History  . Marital status: Single    Spouse name: Not on file  . Number of children: Not on file  . Years of education: Not on file  . Highest education level: Not on file  Occupational History  . Not on file  Social Needs  . Financial resource strain: Not on file  . Food insecurity:    Worry: Not on file    Inability: Not on file  . Transportation needs:    Medical: Not on file    Non-medical: Not on file  Tobacco Use  . Smoking status: Former Smoker    Packs/day: 0.50    Years: 4.00    Pack years: 2.00    Start date: 08/29/1967    Last attempt to quit: 08/29/1971    Years since quitting: 47.4  . Smokeless tobacco: Never Used  . Tobacco comment: discussed AAA and agreed to fup   Substance and Sexual Activity  . Alcohol use: No  . Drug use: No  . Sexual activity: Not on file  Lifestyle  . Physical activity:    Days per week: Not on file    Minutes per session: Not on file  . Stress: Not on file  Relationships  . Social connections:    Talks on phone: Not on file    Gets together: Not on file    Attends religious service: Not on file    Active member of club or organization: Not on file    Attends meetings of clubs or organizations: Not on file    Relationship status: Not on file  Other Topics Concern  . Not on file  Social History Narrative  . Not on file    Vitals:   01/10/19 1122  BP: 124/82  Pulse: 64  Resp: 12  Temp: 98.4 F (36.9 C)  SpO2: 98%   Body mass index is 22.99 kg/m.  Physical Exam  Nursing note reviewed. Constitutional: He is oriented to person, place, and time. He appears well-developed and well-nourished. No distress.  HENT:  Head: Normocephalic and atraumatic.  Right Ear: External ear normal.  Left Ear: External ear normal.  Mouth/Throat: Oropharynx is  clear and moist and mucous membranes are normal.  Cerumen impaction bilateral, not able to see TM.  Eyes: Pupils are equal, round, and reactive to light. Conjunctivae are normal.  Neck: Carotid bruit is not present. No thyroid mass present.  Cardiovascular: Normal rate and regular rhythm.  No murmur heard. Respiratory: Effort normal and breath sounds normal. No respiratory distress.  GI: Soft. He exhibits no mass. There is no hepatomegaly. There is no abdominal tenderness.  Musculoskeletal:        General: No edema.  Lymphadenopathy:    He has no cervical adenopathy.  Neurological: He is alert and oriented to person, place, and time. He has normal strength. No cranial nerve deficit. Gait normal.  Skin: Skin is warm. No rash  noted. No erythema.  Psychiatric: His mood appears anxious.  Well groomed, good eye contact.    ASSESSMENT AND PLAN:   [name] was seen today for bump on left side of throat.  Diagnoses and all orders for this visit:  Bilateral impacted cerumen He is not interested in ear lavage. Recommend OTC Debrox. Continue avoiding Q-tips use. Instructed about warning signs. Follow-up as needed.  Dizziness Possible causes discussed. Neurologic examination today do not suggest a serious process. No neck masses and no bruit appreciated. Avoid trigger factor: Applying pressure on left-sided neck. Fall precautions. Instructed about warning signs.  History of hemorrhagic cerebrovascular accident (CVA) without residual deficits No residual focal deficit. Adequate BP controlled.  Also recommend resuming Atorvastatin,we discussed benefits.  Carotid artery stenosis We went through carotid Dupplex results, he has a few questions about report,all answered to the best of my knowledge. Strongly recommend resuming Atorvastatin 10 mg,benefits discussed.  We will repeat carotid US. Instructed about warning signs. -     VAS US CAROTID; Future   Return if symptoms  worsen or fail to improve, for Keep next appt.  -[name] was advised to seek immediate medical attention if sudden worsening symptoms or to follow if they persist or if new concerns arise.     Valiant Dills G. Martinique, MD  [facility]. [place] office.

## 2019-01-10 NOTE — Patient Instructions (Signed)
A few things to remember from today's visit:   Bilateral impacted cerumen  Stenosis of carotid artery, unspecified laterality  DEBROX ear drops.   Earwax Buildup, Adult The ears produce a substance called earwax that helps keep bacteria out of the ear and protects the skin in the ear canal. Occasionally, earwax can build up in the ear and cause discomfort or hearing loss. What increases the risk? This condition is more likely to develop in people who:  Are male.  Are elderly.  Naturally produce more earwax.  Clean their ears often with cotton swabs.  Use earplugs often.  Use in-ear headphones often.  Wear hearing aids.  Have narrow ear canals.  Have earwax that is overly thick or sticky.  Have eczema.  Are dehydrated.  Have excess hair in the ear canal. What are the signs or symptoms? Symptoms of this condition include:  Reduced or muffled hearing.  A feeling of fullness in the ear or feeling that the ear is plugged.  Fluid coming from the ear.  Ear pain.  Ear itch.  Ringing in the ear.  Coughing.  An obvious piece of earwax that can be seen inside the ear canal. How is this diagnosed? This condition may be diagnosed based on:  Your symptoms.  Your medical history.  An ear exam. During the exam, your health care provider will look into your ear with an instrument called an otoscope. You may have tests, including a hearing test. How is this treated? This condition may be treated by:  Using ear drops to soften the earwax.  Having the earwax removed by a health care provider. The health care provider may: ? Flush the ear with water. ? Use an instrument that has a loop on the end (curette). ? Use a suction device.  Surgery to remove the wax buildup. This may be done in severe cases. Follow these instructions at home:   Take over-the-counter and prescription medicines only as told by your health care provider.  Do not put any objects,  including cotton swabs, into your ear. You can clean the opening of your ear canal with a washcloth or facial tissue.  Follow instructions from your health care provider about cleaning your ears. Do not over-clean your ears.  Drink enough fluid to keep your urine clear or pale yellow. This will help to thin the earwax.  Keep all follow-up visits as told by your health care provider. If earwax builds up in your ears often or if you use hearing aids, consider seeing your health care provider for routine, preventive ear cleanings. Ask your health care provider how often you should schedule your cleanings.  If you have hearing aids, clean them according to instructions from the manufacturer and your health care provider. Contact a health care provider if:  You have ear pain.  You develop a fever.  You have blood, pus, or other fluid coming from your ear.  You have hearing loss.  You have ringing in your ears that does not go away.  Your symptoms do not improve with treatment.  You feel like the room is spinning (vertigo). Summary  Earwax can build up in the ear and cause discomfort or hearing loss.  The most common symptoms of this condition include reduced or muffled hearing and a feeling of fullness in the ear or feeling that the ear is plugged.  This condition may be diagnosed based on your symptoms, your medical history, and an ear exam.  This condition may  be treated by using ear drops to soften the earwax or by having the earwax removed by a health care provider.  Do not put any objects, including cotton swabs, into your ear. You can clean the opening of your ear canal with a washcloth or facial tissue. This information is not intended to replace advice given to you by your health care provider. Make sure you discuss any questions you have with your health care provider. Document Released: 09/21/2004 Document Revised: 07/26/2017 Document Reviewed: 10/25/2016 Elsevier  Interactive Patient Education  2019 [employer].  Please be sure medication list is accurate. If a new problem present, please set up appointment sooner than planned today.

## 2019-01-10 NOTE — Telephone Encounter (Signed)
Returned call to patient and scheduled ov for 01/10/2019 with Dr. Martinique. Nothing further needed at this time. Copied from [place] [phone]. Topic: Appointment Scheduling - Scheduling Inquiry for Clinic >> Jan 09, 2019  6:26 PM [name], Marland Kitchen wrote: Reason for CRM: pt would like to make appt for internal bump in throat. Pt refused to speak to nurse triage.  He only wants to see Dr Martinique.  Please call

## 2019-03-26 ENCOUNTER — Encounter: Payer: Medicare HMO | Admitting: Family Medicine

## 2019-03-27 ENCOUNTER — Ambulatory Visit: Payer: Medicare HMO | Admitting: Adult Health

## 2019-04-22 ENCOUNTER — Other Ambulatory Visit: Payer: Self-pay | Admitting: Surgery

## 2019-04-22 DIAGNOSIS — K439 Ventral hernia without obstruction or gangrene: Secondary | ICD-10-CM | POA: Diagnosis not present

## 2019-04-24 ENCOUNTER — Other Ambulatory Visit: Payer: Self-pay | Admitting: Family Medicine

## 2019-05-02 ENCOUNTER — Other Ambulatory Visit (HOSPITAL_COMMUNITY): Payer: Medicare HMO

## 2019-05-02 NOTE — Pre-Procedure Instructions (Signed)
[place], [place] [address] Phone: [phone] Fax: [phone]  [place], [place] [address] Phone: [phone] Fax: [phone]      Your procedure is scheduled on  05-08-19 Thursday from [id number]  Report to [facility] Entrance "A" at 0700 AM ., and check in at the Admitting office.  Call this number if you have problems the morning of surgery:  [phone]  Call [phone] if you have any questions prior to your surgery date Monday-Friday 8am-4pm    Remember:  Do not eat after midnight the night before your surgery  You may drink clear liquids until 6 AM the morning of your surgery.   Clear liquids allowed are: Water, Non-Citrus Juices (without pulp), Carbonated Beverages, Clear Tea, Black Coffee Only, and Gatorade   Take these medicines the morning of surgery with A SIP OF WATER : Amlodipine (Norvasc) Carvedilol (Coreg)  7 days prior to surgery STOP taking any Aspirin (unless otherwise instructed by your surgeon), Aleve, Naproxen, Ibuprofen, Motrin, Advil, Goody's, BC's, all herbal medications, fish oil, and all vitamins.   The Morning of Surgery  Do not wear jewelry, make-up or nail polish.  Do not wear lotions, powders, or perfumes/colognes, or deodorant  Do not shave 48 hours prior to surgery.  Men may shave face and neck.  Do not bring valuables to the hospital.  [facility] is not responsible for any belongings or valuables.  If you are a smoker, DO NOT Smoke 24 hours prior to surgery IF you wear a CPAP at night please bring your mask, tubing, and machine the morning of surgery   Remember that you must have someone to transport you home after your surgery, and remain with you for 24 hours if you are discharged the same day.  Contacts, glasses, hearing aids, dentures or  bridgework may not be worn into surgery.   Leave your suitcase in the car.  After surgery it may be brought to your room.  For patients admitted to the hospital, discharge time will be determined by your treatment team.  Patients discharged the day of surgery will not be allowed to drive home.    Special instructions:   - Preparing For Surgery  Before surgery, you can play an important role. Because skin is not sterile, your skin needs to be as free of germs as possible. You can reduce the number of germs on your skin by washing with CHG (chlorahexidine gluconate) Soap before surgery.  CHG is an antiseptic cleaner which kills germs and bonds with the skin to continue killing germs even after washing.    Oral Hygiene is also important to reduce your risk of infection.  Remember - BRUSH YOUR TEETH THE MORNING OF SURGERY WITH YOUR REGULAR TOOTHPASTE  Please do not use if you have an allergy to CHG or antibacterial soaps. If your skin becomes reddened/irritated stop using the CHG.  Do not shave (including legs and underarms) for at least 48 hours prior to first CHG shower. It is OK to shave your face.  Please follow these instructions carefully.   1. Shower the NIGHT BEFORE SURGERY and the MORNING OF SURGERY with CHG Soap.   2. If you chose to wash your hair, wash your hair first as usual with your normal shampoo.  3. After you shampoo, rinse your hair and body  thoroughly to remove the shampoo.  4. Use CHG as you would any other liquid soap. You can apply CHG directly to the skin and wash gently with a scrungie or a clean washcloth.   5. Apply the CHG Soap to your body ONLY FROM THE NECK DOWN.  Do not use on open wounds or open sores. Avoid contact with your eyes, ears, mouth and genitals (private parts). Wash Face and genitals (private parts)  with your normal soap.   6. Wash thoroughly, paying special attention to the area where your surgery will be performed.  7. Thoroughly  rinse your body with warm water from the neck down.  8. DO NOT shower/wash with your normal soap after using and rinsing off the CHG Soap.  9. Pat yourself dry with a CLEAN TOWEL.  10. Wear CLEAN PAJAMAS to bed the night before surgery, wear comfortable clothes the morning of surgery  11. Place CLEAN SHEETS on your bed the night of your first shower and DO NOT SLEEP WITH PETS.  Day of Surgery:  Do not apply any deodorants/lotions. Please shower the morning of surgery with the CHG soap  Please wear clean clothes to the hospital/surgery center.   Remember to brush your teeth WITH YOUR REGULAR TOOTHPASTE.   Please read over the  fact sheets that you were given.

## 2019-05-06 ENCOUNTER — Encounter (HOSPITAL_COMMUNITY): Payer: Self-pay

## 2019-05-06 ENCOUNTER — Other Ambulatory Visit: Payer: Self-pay

## 2019-05-06 ENCOUNTER — Other Ambulatory Visit (HOSPITAL_COMMUNITY): Payer: Medicare HMO

## 2019-05-06 ENCOUNTER — Encounter (HOSPITAL_COMMUNITY)
Admission: RE | Admit: 2019-05-06 | Discharge: 2019-05-06 | Disposition: A | Discharge status: Home / Self Care | Payer: Medicare HMO | Source: Ambulatory Visit | Attending: Surgery | Admitting: Surgery

## 2019-05-06 ENCOUNTER — Other Ambulatory Visit (HOSPITAL_COMMUNITY)
Admission: RE | Admit: 2019-05-06 | Discharge: 2019-05-06 | Disposition: A | Discharge status: Home / Self Care | Payer: Medicare HMO | Source: Ambulatory Visit | Attending: Surgery | Admitting: Surgery

## 2019-05-06 DIAGNOSIS — Z01812 Encounter for preprocedural laboratory examination: Secondary | ICD-10-CM | POA: Insufficient documentation

## 2019-05-06 DIAGNOSIS — Z20828 Contact with and (suspected) exposure to other viral communicable diseases: Secondary | ICD-10-CM | POA: Diagnosis not present

## 2019-05-06 LAB — CBC
HCT: 41.9 % (ref 39.0–52.0)
Hemoglobin: 14.1 g/dL (ref 13.0–17.0)
MCH: 30.7 pg (ref 26.0–34.0)
MCHC: 33.7 g/dL (ref 30.0–36.0)
MCV: 91.3 fL (ref 80.0–100.0)
Platelets: 190 10*3/uL (ref 150–400)
RBC: 4.59 MIL/uL (ref 4.22–5.81)
RDW: 13.2 % (ref 11.5–15.5)
WBC: 5.1 10*3/uL (ref 4.0–10.5)
nRBC: 0 % (ref 0.0–0.2)

## 2019-05-06 LAB — BASIC METABOLIC PANEL
Anion gap: 10 (ref 5–15)
BUN: 13 mg/dL (ref 8–23)
CO2: 25 mmol/L (ref 22–32)
Calcium: 9.1 mg/dL (ref 8.9–10.3)
Chloride: 102 mmol/L (ref 98–111)
Creatinine, Ser: 1.15 mg/dL (ref 0.61–1.24)
GFR calc Af Amer: >60 mL/min (ref >60)
GFR calc non Af Amer: >60 mL/min (ref >60)
Glucose, Bld: 114 mg/dL — ABNORMAL HIGH (ref 70–99)
Potassium: 2.9 mmol/L — ABNORMAL LOW (ref 3.5–5.1)
Sodium: 137 mmol/L (ref 135–145)

## 2019-05-06 NOTE — Progress Notes (Addendum)
  Coronavirus Screening Scheduled for COVID test today. Have you experienced the following symptoms:  Cough yes/no: No Fever (>100.34F)  yes/no: No Runny nose yes/no: No Sore throat yes/no: No Difficulty breathing/shortness of breath  yes/no: No Loss of smell or tatse.-No H/O travel in past 3 days-No  PCP - Dr Sharia Reeve  Cardiologist - denies  GI-Dr Vincent Gros- Dr Jarome Matin  Chest x-ray - NA  EKG - 07-23-18 epic  Stress Test - denies  ECHO - 07-24-18 epic  Cardiac Cath - denies  AICD-denies PM-denies LOOP-denies  Sleep Study - denies CPAP - NA  LABS-CBC,BMP K+=2.9. IB message sent to Dr. Ninfa Linden  ASA-denies  ERAS-clear liquids  HA1C-denies Fasting Blood Sugar -  Checks Blood Sugar _0____ times a day  Anesthesia-Y. H/o CVA, abnormal lab.  Pt denies having chest pain, sob,palpitations, dizziness, headaches, vision difficulties or fever at this time. All instructions explained to the pt, with a verbal understanding of the material. Pt agrees to go over the instructions while at home for a better understanding. Pt also instructed to self quarantine after being tested for COVID-19. The opportunity to ask questions was provided.

## 2019-05-07 LAB — SARS CORONAVIRUS 2 (TAT 6-24 HRS): SARS Coronavirus 2: NEGATIVE

## 2019-05-07 NOTE — Anesthesia Preprocedure Evaluation (Addendum)
Anesthesia Evaluation  Patient identified by MRN, date of birth, ID band Patient awake    Reviewed: Allergy & Precautions, NPO status , Patient's Chart, lab work & pertinent test results, reviewed documented beta blocker date and time   History of Anesthesia Complications Negative for: history of anesthetic complications  Airway Mallampati: III  TM Distance: >3 FB Neck ROM: Full    Dental  (+) Dental Advisory Given   Pulmonary former smoker (quit 1973),  05/06/2019 SARS coronavirus NEG   Pulmonary exam normal breath sounds clear to auscultation       Cardiovascular hypertension, Pt. on medications and Pt. on home beta blockers (-) angina+ Peripheral Vascular Disease (asc aorta 4.6cm)  Normal cardiovascular exam Rhythm:Regular Rate:Normal  '19 ECHO: EF 65-70%, valves OK   Neuro/Psych CVA, No Residual Symptoms negative psych ROS   GI/Hepatic negative GI ROS, Neg liver ROS,   Endo/Other  negative endocrine ROS  Renal/GU negative Renal ROS     Musculoskeletal   Abdominal   Peds  Hematology negative hematology ROS (+)   Anesthesia Other Findings   Reproductive/Obstetrics                           Anesthesia Physical Anesthesia Plan  ASA: III  Anesthesia Plan: General   Post-op Pain Management:    Induction: Intravenous  PONV Risk Score and Plan: 2 and Ondansetron and Dexamethasone  Airway Management Planned: LMA  Additional Equipment:   Intra-op Plan:   Post-operative Plan:   Informed Consent: I have reviewed the patients History and Physical, chart, labs and discussed the procedure including the risks, benefits and alternatives for the proposed anesthesia with the patient or authorized representative who has indicated his/her understanding and acceptance.     Dental advisory given  Plan Discussed with: CRNA and Surgeon  Anesthesia Plan Comments: (Recent left BG hemorrhage in  setting of elevated BP on 07/23/2018 secondary to unclear etiology but possibility hemorrhagic infarct vs hypertensive hemorrhage. 2D echo showed an EF of 65 to 70% without cardiac source of this identified and dilated ascending aorta 4.6 cm.  HTN stabilized during admission and resumed home medications. No residual deficits. Continues to follow with neurology outpatient. He is also followed closely by PCP Dr. Betty Martinique.  Case discussed with Dr. Fransisco Beau due to recent finding of dilated ascending aorta. By guidelines, pt should have 4.6cm dilation evaluated q6 months with imaging - he is currently overdue for followup. His PCP is out of the office today 9/9 but I did send her a message to make her aware. Per Dr. Fransisco Beau, ok to proceed with case as planned barring any symptomatic changes.   K+ 2.9 on preop labs, will recheck DOS.   TTE 07/24/18: - Left ventricle: The cavity size was normal. There was moderate   concentric hypertrophy. Systolic function was vigorous. The   estimated ejection fraction was in the range of 65% to 70%. Wall   motion was normal; there were no regional wall motion   abnormalities. Doppler parameters are consistent with abnormal   left ventricular relaxation (grade 1 diastolic dysfunction). - Aortic valve: There was no regurgitation. - Aorta: Ascending aorta maximal dimension: 46 mm. - Ascending aorta: The ascending aorta was moderately dilated. - Mitral valve: There was no significant regurgitation. - Left atrium: The atrium was mildly dilated. - Right ventricle: Systolic function was normal. - Tricuspid valve: There was trivial regurgitation. - Pulmonic valve: There was mild regurgitation.  Impressions:  -  No cardiac source of emboli was indentified. Ascending aorta   dilated at 4.6 cm. )   Anesthesia Quick Evaluation

## 2019-05-07 NOTE — H&P (Signed)
   [name] Documented: 04/22/2019 3:55 PM Location: [facility] Patient #: [id number] DOB: 04/03/1951 Single / Language: [name] / Race: White Male   History of Present Illness ([name] A. Ninfa Linden MD; 04/22/2019 4:10 PM) The patient is a 68 year old male who presents with an abdominal wall hernia. This gentleman is a self-referral for a possible ventral hernia. He had undergone bilateral laparoscopic inguinal hernia repairs with mesh by Dr. Zella Richer in 2016. He has a history of rectus diastases but has noticed when he feels like maybe a hernia along with this. He has intentionally lost weight from 180 pounds to 160 pounds. He is very active. He has some discomfort but no obstructive symptoms.  Past Medical History:  Diagnosis Date  . Diverticulosis   . Hyperlipidemia   . Hypertension   . Internal hemorrhoid   . Vitamin D deficiency     Medication History (Andreas Blower, CMA; 04/22/2019 4:00 PM) Carvedilol (6.25MG  Tablet, Oral) Active. Enalapril Maleate (20MG  Tablet, Oral) Active. AmLODIPine Besylate (5MG  Tablet, Oral) Active. Medications Reconciled  Vitals (Armen Ferguson CMA; 04/22/2019 3:58 PM) 04/22/2019 3:57 PM Weight: 159.5 lb Height: 70in Body Surface Area: 1.9 m Body Mass Index: 22.89 kg/m  Temp.: 98.17F  Pulse: 68 (Regular)  P.OX: 97% (Room air) BP: 134/90(Sitting, Left Arm, Standard)       Physical Exam (Coron Rossano A. Ninfa Linden MD; 04/22/2019 4:11 PM) General Mental Status-Alert. General Appearance-Consistent with stated age. Hydration-Well hydrated. Voice-Normal.  Head and Neck Head-normocephalic, atraumatic with no lesions or palpable masses.  Eye Eyeball - Bilateral-Extraocular movements intact. Sclera/Conjunctiva - Bilateral-No scleral icterus.  Chest and Lung Exam Chest and lung exam reveals -quiet, even and easy respiratory effort with no use of accessory muscles and on auscultation, normal  breath sounds, no adventitious sounds and normal vocal resonance. Inspection Chest Wall - Normal. Back - normal.  Cardiovascular Cardiovascular examination reveals -on palpation PMI is normal in location and amplitude, no palpable S3 or S4. Normal cardiac borders., normal heart sounds, regular rate and rhythm with no murmurs, carotid auscultation reveals no bruits and normal pedal pulses bilaterally.  Abdomen Inspection Inspection of the abdomen reveals - No Hernias. Skin - Scar - no surgical scars. Palpation/Percussion Palpation and Percussion of the abdomen reveal - Soft, Non Tender, No Rebound tenderness, No Rigidity (guarding) and No hepatosplenomegaly. Auscultation Auscultation of the abdomen reveals - Bowel sounds normal. Note: He does have a small area rectus diastases. Above the umbilicus and just to the right of the rectus diastases is a hernia defect with some omentum involved which I'm able to reduce. It is mildly tender.   Neurologic - Did not examine.  Musculoskeletal - Did not examine.    Assessment & Plan (Kendahl Bumgardner A. Ninfa Linden MD; 04/22/2019 4:12 PM)  VENTRAL HERNIA (K43.9) Impression: I discussed the diagnosis of his ventral hernia as well as again the rectus diastases. I would recommend an outpatient ventral hernia repair with mesh. I discussed the reasons for this in detail as he is quite active and symptomatic. I discussed the surgical procedure in detail. We discussed the risks which include but are not limited to bleeding, infection, hernia recurrence, injury to surrounding structures, etc. He understands and wishes to proceed with surgery which will be scheduled.

## 2019-05-08 ENCOUNTER — Encounter (HOSPITAL_COMMUNITY): Payer: Self-pay | Admitting: Certified Registered Nurse Anesthetist

## 2019-05-08 ENCOUNTER — Ambulatory Visit (HOSPITAL_COMMUNITY): Payer: Medicare HMO | Admitting: Physician Assistant

## 2019-05-08 ENCOUNTER — Encounter (HOSPITAL_COMMUNITY)
Admission: RE | Disposition: A | Discharge status: Home / Self Care | Payer: Self-pay | Source: Home / Self Care | Attending: Surgery

## 2019-05-08 ENCOUNTER — Ambulatory Visit (HOSPITAL_COMMUNITY)
Admission: RE | Admit: 2019-05-08 | Discharge: 2019-05-08 | Disposition: A | Discharge status: Home / Self Care | Payer: Medicare HMO | Attending: Surgery | Admitting: Surgery

## 2019-05-08 DIAGNOSIS — Z1159 Encounter for screening for other viral diseases: Secondary | ICD-10-CM | POA: Diagnosis not present

## 2019-05-08 DIAGNOSIS — K439 Ventral hernia without obstruction or gangrene: Secondary | ICD-10-CM | POA: Diagnosis not present

## 2019-05-08 DIAGNOSIS — Z8673 Personal history of transient ischemic attack (TIA), and cerebral infarction without residual deficits: Secondary | ICD-10-CM | POA: Diagnosis not present

## 2019-05-08 DIAGNOSIS — Z79899 Other long term (current) drug therapy: Secondary | ICD-10-CM | POA: Diagnosis not present

## 2019-05-08 DIAGNOSIS — I739 Peripheral vascular disease, unspecified: Secondary | ICD-10-CM | POA: Diagnosis not present

## 2019-05-08 DIAGNOSIS — Z87891 Personal history of nicotine dependence: Secondary | ICD-10-CM | POA: Diagnosis not present

## 2019-05-08 DIAGNOSIS — I10 Essential (primary) hypertension: Secondary | ICD-10-CM | POA: Insufficient documentation

## 2019-05-08 DIAGNOSIS — E785 Hyperlipidemia, unspecified: Secondary | ICD-10-CM | POA: Diagnosis not present

## 2019-05-08 HISTORY — PX: VENTRAL HERNIA REPAIR: SHX424

## 2019-05-08 LAB — POCT I-STAT 4, (NA,K, GLUC, HGB,HCT)
Glucose, Bld: 97 mg/dL (ref 70–99)
HCT: 41 % (ref 39.0–52.0)
Hemoglobin: 13.9 g/dL (ref 13.0–17.0)
Potassium: 3.4 mmol/L — ABNORMAL LOW (ref 3.5–5.1)
Sodium: 138 mmol/L (ref 135–145)

## 2019-05-08 SURGERY — REPAIR, HERNIA, VENTRAL
Anesthesia: General | Site: Abdomen

## 2019-05-08 MED ORDER — TRAMADOL HCL 50 MG PO TABS: 50.0000 mg | ORAL_TABLET | Freq: Four times a day (QID) | ORAL | 0 refills | Status: DC | PRN

## 2019-05-08 MED ORDER — BUPIVACAINE HCL (PF) 0.25 % IJ SOLN
INTRAMUSCULAR | Status: DC | PRN
  Administered 2019-05-08: 14 mL

## 2019-05-08 MED ORDER — GABAPENTIN 300 MG PO CAPS
300.0000 mg | ORAL_CAPSULE | ORAL | Status: AC
  Administered 2019-05-08: 08:00:00 300 mg via ORAL
  Filled 2019-05-08: qty 1

## 2019-05-08 MED ORDER — BUPIVACAINE-EPINEPHRINE (PF) 0.25% -1:200000 IJ SOLN
INTRAMUSCULAR | Status: AC
  Filled 2019-05-08: qty 30

## 2019-05-08 MED ORDER — FENTANYL CITRATE (PF) 100 MCG/2ML IJ SOLN
INTRAMUSCULAR | Status: DC | PRN
  Administered 2019-05-08 (×2): 25 ug via INTRAVENOUS

## 2019-05-08 MED ORDER — PROMETHAZINE HCL 25 MG/ML IJ SOLN: 6.2500 mg | INTRAMUSCULAR | Status: DC | PRN

## 2019-05-08 MED ORDER — CHLORHEXIDINE GLUCONATE CLOTH 2 % EX PADS: 6.0000 | MEDICATED_PAD | Freq: Once | CUTANEOUS | Status: DC

## 2019-05-08 MED ORDER — 0.9 % SODIUM CHLORIDE (POUR BTL) OPTIME
TOPICAL | Status: DC | PRN
  Administered 2019-05-08: 09:00:00 1000 mL

## 2019-05-08 MED ORDER — LACTATED RINGERS IV SOLN
INTRAVENOUS | Status: DC
  Administered 2019-05-08: 08:00:00 via INTRAVENOUS

## 2019-05-08 MED ORDER — CIPROFLOXACIN IN D5W 400 MG/200ML IV SOLN
400.0000 mg | INTRAVENOUS | Status: AC
  Administered 2019-05-08: 400 mg via INTRAVENOUS
  Filled 2019-05-08: qty 200

## 2019-05-08 MED ORDER — PROPOFOL 10 MG/ML IV BOLUS
INTRAVENOUS | Status: AC
  Filled 2019-05-08: qty 20

## 2019-05-08 MED ORDER — MIDAZOLAM HCL 5 MG/5ML IJ SOLN
INTRAMUSCULAR | Status: DC | PRN
  Administered 2019-05-08 (×2): 1 mg via INTRAVENOUS

## 2019-05-08 MED ORDER — FENTANYL CITRATE (PF) 250 MCG/5ML IJ SOLN
INTRAMUSCULAR | Status: AC
  Filled 2019-05-08: qty 5

## 2019-05-08 MED ORDER — MIDAZOLAM HCL 2 MG/2ML IJ SOLN: 0.5000 mg | Freq: Once | INTRAMUSCULAR | Status: DC | PRN

## 2019-05-08 MED ORDER — FENTANYL CITRATE (PF) 100 MCG/2ML IJ SOLN: 25.0000 ug | INTRAMUSCULAR | Status: DC | PRN

## 2019-05-08 MED ORDER — MIDAZOLAM HCL 2 MG/2ML IJ SOLN
INTRAMUSCULAR | Status: AC
  Filled 2019-05-08: qty 2

## 2019-05-08 MED ORDER — MEPERIDINE HCL 25 MG/ML IJ SOLN: 6.2500 mg | INTRAMUSCULAR | Status: DC | PRN

## 2019-05-08 MED ORDER — PROPOFOL 10 MG/ML IV BOLUS
INTRAVENOUS | Status: DC | PRN
  Administered 2019-05-08: 100 mg via INTRAVENOUS
  Administered 2019-05-08: 50 mg via INTRAVENOUS

## 2019-05-08 MED ORDER — ACETAMINOPHEN 500 MG PO TABS
1000.0000 mg | ORAL_TABLET | ORAL | Status: AC
  Administered 2019-05-08: 1000 mg via ORAL
  Filled 2019-05-08: qty 2

## 2019-05-08 MED ORDER — LIDOCAINE 2% (20 MG/ML) 5 ML SYRINGE
INTRAMUSCULAR | Status: DC | PRN
  Administered 2019-05-08 (×2): 20 mg via INTRAVENOUS

## 2019-05-08 MED ORDER — STERILE WATER FOR IRRIGATION IR SOLN
Status: DC | PRN
  Administered 2019-05-08: 1000 mL

## 2019-05-08 MED ORDER — EPHEDRINE SULFATE-NACL 50-0.9 MG/10ML-% IV SOSY
PREFILLED_SYRINGE | INTRAVENOUS | Status: DC | PRN
  Administered 2019-05-08: 5 mg via INTRAVENOUS
  Administered 2019-05-08 (×2): 10 mg via INTRAVENOUS

## 2019-05-08 SURGICAL SUPPLY — 40 items
ADH SKN CLS APL DERMABOND .7 (GAUZE/BANDAGES/DRESSINGS) ×1
APL PRP STRL LF DISP 70% ISPRP (MISCELLANEOUS) ×1
BLADE CLIPPER SURG (BLADE) IMPLANT
CANISTER SUCT 3000ML PPV (MISCELLANEOUS) ×3 IMPLANT
CHLORAPREP W/TINT 26 (MISCELLANEOUS) ×3 IMPLANT
COVER SURGICAL LIGHT HANDLE (MISCELLANEOUS) ×3 IMPLANT
COVER WAND RF STERILE (DRAPES) ×1 IMPLANT
DERMABOND ADVANCED (GAUZE/BANDAGES/DRESSINGS) ×2
DERMABOND ADVANCED .7 DNX12 (GAUZE/BANDAGES/DRESSINGS) IMPLANT
DRAPE LAPAROSCOPIC ABDOMINAL (DRAPES) ×3 IMPLANT
DRSG TEGADERM 4X4.75 (GAUZE/BANDAGES/DRESSINGS) ×3 IMPLANT
DRSG TELFA 3X8 NADH (GAUZE/BANDAGES/DRESSINGS) IMPLANT
ELECT REM PT RETURN 9FT ADLT (ELECTROSURGICAL) ×3
ELECTRODE REM PT RTRN 9FT ADLT (ELECTROSURGICAL) ×1 IMPLANT
GAUZE SPONGE 4X4 12PLY STRL (GAUZE/BANDAGES/DRESSINGS) IMPLANT
GLOVE SURG SIGNA 7.5 PF LTX (GLOVE) ×3 IMPLANT
GOWN STRL REUS W/ TWL LRG LVL3 (GOWN DISPOSABLE) ×2 IMPLANT
GOWN STRL REUS W/ TWL XL LVL3 (GOWN DISPOSABLE) ×1 IMPLANT
GOWN STRL REUS W/TWL LRG LVL3 (GOWN DISPOSABLE) ×6
GOWN STRL REUS W/TWL XL LVL3 (GOWN DISPOSABLE) ×3
KIT BASIN OR (CUSTOM PROCEDURE TRAY) ×3 IMPLANT
KIT TURNOVER KIT B (KITS) ×3 IMPLANT
NDL HYPO 25GX1X1/2 BEV (NEEDLE) ×1 IMPLANT
NEEDLE HYPO 25GX1X1/2 BEV (NEEDLE) ×3 IMPLANT
NS IRRIG 1000ML POUR BTL (IV SOLUTION) ×3 IMPLANT
PACK GENERAL/GYN (CUSTOM PROCEDURE TRAY) ×3 IMPLANT
PAD ARMBOARD 7.5X6 YLW CONV (MISCELLANEOUS) ×3 IMPLANT
PAD DRESSING TELFA 3X8 NADH (GAUZE/BANDAGES/DRESSINGS) IMPLANT
PENCIL SMOKE EVACUATOR (MISCELLANEOUS) ×3 IMPLANT
STAPLER VISISTAT 35W (STAPLE) IMPLANT
SUT ETHIBOND 0 (SUTURE) ×2 IMPLANT
SUT MNCRL AB 4-0 PS2 18 (SUTURE) ×3 IMPLANT
SUT NOVA NAB DX-16 0-1 5-0 T12 (SUTURE) IMPLANT
SUT PROLENE 1 CT (SUTURE) IMPLANT
SUT VIC AB 3-0 SH 27 (SUTURE) ×3
SUT VIC AB 3-0 SH 27XBRD (SUTURE) ×1 IMPLANT
SYR CONTROL 10ML LL (SYRINGE) ×3 IMPLANT
TOWEL GREEN STERILE (TOWEL DISPOSABLE) ×3 IMPLANT
TOWEL GREEN STERILE FF (TOWEL DISPOSABLE) ×3 IMPLANT
TRAY FOLEY MTR SLVR 14FR STAT (SET/KITS/TRAYS/PACK) IMPLANT

## 2019-05-08 NOTE — Op Note (Signed)
VENTRAL HERNIA REPAIR  Procedure Note  [name] 05/08/2019   Pre-op Diagnosis: VENTRAL HERNIA     Post-op Diagnosis: same  Procedure(s): VENTRAL HERNIA REPAIR  Surgeon(s): Coralie Keens, MD  Anesthesia: General  Staff:  Circulator: Hal Morales, RN; Small, Benjaman Lobe, RN Relief Scrub: Stonesifer, Feliberto Gottron, RN Scrub Person: Caswell Corwin T  Estimated Blood Loss: Minimal               Findings: The patient was found to have a small ventral hernia at the midline above the umbilicus.  The fascial defect was only approximately 7 to 8 mm.  It was repaired primarily.  Procedure: The patient was brought to the operating room and identifies correct patient.  He is placed upon the operating table and general anesthesia was induced.  His abdomen was then prepped and draped in usual sterile fashion.  I anesthetized the skin over the palpable hernia with Marcaine.  I then made a longitudinal incision with a scalpel.  I dissected down to the hernia sac.  I then dissected the sac down to the fascia.  I excised the sac and the preperitoneal fat inside the sac.  The actual fascial defect was only 7 to 8 mm in size.  I elected to close it primarily with 2 separate 0 Ethibond sutures.  Easy closure of the fascia was achieved.  I anesthetized the surrounding fascia and subcutaneous tissue further with Marcaine.  I then closed subcutaneous tissue with interrupted 3-0 Vicryl sutures and closed skin with running 4-0 Monocryl.  Dermabond was then applied.  The patient tolerated the procedure well.  All the counts were correct at the end of the procedure.  The patient was then extubated in the operating room and taken in stable addition to the recovery room.          Coralie Keens   Date: 05/08/2019  Time: 9:08 AM

## 2019-05-08 NOTE — Anesthesia Postprocedure Evaluation (Signed)
Anesthesia Post Note  Patient: [name]  Procedure(s) Performed: VENTRAL HERNIA REPAIR (N/A Abdomen)     Patient location during evaluation: PACU Anesthesia Type: General Level of consciousness: awake and alert, oriented and patient cooperative Pain management: pain level controlled Vital Signs Assessment: post-procedure vital signs reviewed and stable Respiratory status: spontaneous breathing, nonlabored ventilation and respiratory function stable Cardiovascular status: blood pressure returned to baseline and stable Postop Assessment: no apparent nausea or vomiting Anesthetic complications: no    Last Vitals:  Vitals:   05/08/19 1015 05/08/19 1029  BP:  135/90  Pulse: (!) 55 (!) 52  Resp: 13 14  Temp: (!) 36.1 C   SpO2: 98% 98%    Last Pain:  Vitals:   05/08/19 1029  TempSrc:   PainSc: 0-No pain                 Jaki Steptoe,E. Jahzion Brogden

## 2019-05-08 NOTE — Discharge Instructions (Signed)
[facility] _______[facility] ° °UMBILICAL OR INGUINAL HERNIA REPAIR: POST OP INSTRUCTIONS ° °Always review your discharge instruction sheet given to you by the facility where your surgery was performed. °IF YOU HAVE DISABILITY OR FAMILY LEAVE FORMS, YOU MUST BRING THEM TO THE OFFICE FOR PROCESSING.   °DO NOT GIVE THEM TO YOUR DOCTOR. ° °1. A  prescription for pain medication may be given to you upon discharge.  Take your pain medication as prescribed, if needed.  If narcotic pain medicine is not needed, then you may take acetaminophen (Tylenol) or ibuprofen (Advil) as needed. °2. Take your usually prescribed medications unless otherwise directed. °If you need a refill on your pain medication, please contact your pharmacy.  They will contact our office to request authorization. Prescriptions will not be filled after 5 pm or on week-ends. °3. You should follow a light diet the first 24 hours after arrival home, such as soup and crackers, etc.  Be sure to include lots of fluids daily.  Resume your normal diet the day after surgery. °4.Most patients will experience some swelling and bruising around the umbilicus or in the groin and scrotum.  Ice packs and reclining will help.  Swelling and bruising can take several days to resolve.  °6. It is common to experience some constipation if taking pain medication after surgery.  Increasing fluid intake and taking a stool softener (such as Colace) will usually help or prevent this problem from occurring.  A mild laxative (Milk of Magnesia or Miralax) should be taken according to package directions if there are no bowel movements after 48 hours. °7. Unless discharge instructions indicate otherwise, you may remove your bandages 24-48 hours after surgery, and you may shower at that time.  You may have steri-strips (small skin tapes) in place directly over the incision.  These strips should be left on the skin for 7-10 days.  If your surgeon used skin glue on the  incision, you may shower in 24 hours.  The glue will flake off over the next 2-3 weeks.  Any sutures or staples will be removed at the office during your follow-up visit. °8. ACTIVITIES:  You may resume regular (light) daily activities beginning the next day--such as daily self-care, walking, climbing stairs--gradually increasing activities as tolerated.  You may have sexual intercourse when it is comfortable.  Refrain from any heavy lifting or straining until approved by your doctor. ° °a.You may drive when you are no longer taking prescription pain medication, you can comfortably wear a seatbelt, and you can safely maneuver your car and apply brakes. °b.RETURN TO WORK:   °_____________________________________________ ° °9.You should see your doctor in the office for a follow-up appointment approximately 2-3 weeks after your surgery.  Make sure that you call for this appointment within a day or two after you arrive home to insure a convenient appointment time. °10.OTHER INSTRUCTIONS: _NO LIFTING MORE THAN 15 TO 20 POUNDS FOR 4 WEEKS °OK TO SHOWER STARTING TOMORROW °ICE PACK, TYLENOL, IBUPROFEN ALSO FOR PAIN________________________ °   _____________________________________ ° °WHEN TO CALL YOUR DOCTOR: °1. Fever over 101.0 °2. Inability to urinate °3. Nausea and/or vomiting °4. Extreme swelling or bruising °5. Continued bleeding from incision. °6. Increased pain, redness, or drainage from the incision ° °The clinic staff is available to answer your questions during regular business hours.  Please don’t hesitate to call and ask to speak to one of the nurses for clinical concerns.  If you have a medical emergency, go to the   the nearest emergency room or call 911.  A surgeon from [facility] is always on call at the hospital   [address], [address], [place], [place]  [address] ?  [address] [address], [place], [place]   [address] [phone] ? [phone] ? FAX [phone] Web site:  [email or web address] 

## 2019-05-08 NOTE — Interval H&P Note (Signed)
History and Physical Interval Note:no change in H and P  05/08/2019 7:05 AM  [name]  has presented today for surgery, with the diagnosis of VENTRAL HERNIA.  The various methods of treatment have been discussed with the patient and family. After consideration of risks, benefits and other options for treatment, the patient has consented to  Procedure(s) with comments: [place] (N/A) - LMA as a surgical intervention.  The patient's history has been reviewed, patient examined, no change in status, stable for surgery.  I have reviewed the patient's chart and labs.  Questions were answered to the patient's satisfaction.     Coralie Keens

## 2019-05-08 NOTE — Transfer of Care (Signed)
Immediate Anesthesia Transfer of Care Note  Patient: [name]  Procedure(s) Performed: VENTRAL HERNIA REPAIR (N/A Abdomen)  Patient Location: PACU  Anesthesia Type:General  Level of Consciousness: drowsy and responds to stimulation  Airway & Oxygen Therapy: Patient Spontanous Breathing and Patient connected to face mask oxygen, OPA  Post-op Assessment: Report given to RN and Post -op Vital signs reviewed and stable  Post vital signs: Reviewed and stable  Last Vitals:  Vitals Value Taken Time  BP 94/61 05/08/19 0913  Temp    Pulse 57 05/08/19 0915  Resp 11 05/08/19 0915  SpO2 96 % 05/08/19 0915  Vitals shown include unvalidated device data.  Last Pain:  Vitals:   05/08/19 0732  TempSrc: Oral         Complications: No apparent anesthesia complications

## 2019-05-09 ENCOUNTER — Encounter (HOSPITAL_COMMUNITY): Payer: Self-pay | Admitting: Surgery

## 2019-06-03 ENCOUNTER — Ambulatory Visit: Payer: Medicare HMO | Admitting: Adult Health

## 2019-06-05 ENCOUNTER — Other Ambulatory Visit: Payer: Self-pay | Admitting: Family Medicine

## 2019-06-23 ENCOUNTER — Other Ambulatory Visit: Payer: Self-pay | Admitting: Family Medicine

## 2019-06-25 ENCOUNTER — Encounter: Payer: Self-pay | Admitting: Family Medicine

## 2019-06-25 ENCOUNTER — Ambulatory Visit (INDEPENDENT_AMBULATORY_CARE_PROVIDER_SITE_OTHER): Payer: Medicare HMO | Admitting: Family Medicine

## 2019-06-25 ENCOUNTER — Other Ambulatory Visit: Payer: Self-pay

## 2019-06-25 VITALS — BP 124/70 | HR 55 | Temp 97.2°F | Resp 12 | Ht 70.0 in | Wt 159.2 lb

## 2019-06-25 DIAGNOSIS — I10 Essential (primary) hypertension: Secondary | ICD-10-CM | POA: Diagnosis not present

## 2019-06-25 DIAGNOSIS — R351 Nocturia: Secondary | ICD-10-CM | POA: Diagnosis not present

## 2019-06-25 DIAGNOSIS — N401 Enlarged prostate with lower urinary tract symptoms: Secondary | ICD-10-CM

## 2019-06-25 DIAGNOSIS — E785 Hyperlipidemia, unspecified: Secondary | ICD-10-CM

## 2019-06-25 DIAGNOSIS — Z1211 Encounter for screening for malignant neoplasm of colon: Secondary | ICD-10-CM

## 2019-06-25 DIAGNOSIS — Z0000 Encounter for general adult medical examination without abnormal findings: Secondary | ICD-10-CM | POA: Diagnosis not present

## 2019-06-25 LAB — LIPID PANEL
Cholesterol: 174 mg/dL (ref 0–200)
HDL: 53.4 mg/dL (ref >39.00)
LDL Cholesterol: 103 mg/dL — ABNORMAL HIGH (ref 0–99)
NonHDL: 120.82
Total CHOL/HDL Ratio: 3
Triglycerides: 87 mg/dL (ref 0.0–149.0)
VLDL: 17.4 mg/dL (ref 0.0–40.0)

## 2019-06-25 LAB — COMPREHENSIVE METABOLIC PANEL
ALT: 10 U/L (ref 0–53)
AST: 14 U/L (ref 0–37)
Albumin: 4.5 g/dL (ref 3.5–5.2)
Alkaline Phosphatase: 87 U/L (ref 39–117)
BUN: 16 mg/dL (ref 6–23)
CO2: 31 mEq/L (ref 19–32)
Calcium: 9.3 mg/dL (ref 8.4–10.5)
Chloride: 101 mEq/L (ref 96–112)
Creatinine, Ser: 0.99 mg/dL (ref 0.40–1.50)
GFR: 74.68 mL/min (ref >60.00)
Glucose, Bld: 88 mg/dL (ref 70–99)
Potassium: 3.8 mEq/L (ref 3.5–5.1)
Sodium: 139 mEq/L (ref 135–145)
Total Bilirubin: 0.7 mg/dL (ref 0.2–1.2)
Total Protein: 6.9 g/dL (ref 6.0–8.3)

## 2019-06-25 NOTE — Patient Instructions (Signed)
A few things to remember from today's visit:   Routine general medical examination at a health care facility  Essential hypertension - Plan: Comprehensive metabolic panel  Hyperlipidemia, unspecified hyperlipidemia type - Plan: Comprehensive metabolic panel, Lipid panel  Vitamin D deficiency  BPH associated with nocturia - Plan: PSA(Must document that pt has been informed of limitations of PSA testing.)  Colon cancer screening - Plan: Ambulatory referral to Gastroenterology   At least 150 minutes of moderate exercise per week, daily brisk walking for 15-30 min is a good exercise option. Healthy diet low in saturated (animal) fats and sweets and consisting of fresh fruits and vegetables, lean meats such as fish and white chicken and whole grains.  - Vaccines:  Tdap vaccine every 10 years.  Shingles vaccine recommended at age 56, could be given after 70 years of age but not sure about insurance coverage.  Pneumonia vaccines:  Pneumovax at 86.   -Screening recommendations for low/normal risk males:  Screening for diabetes at age 64 and every 3 years. Earlier screening if cardiovascular risk factors.  Colon cancer screening at age 90 and until age 65.  Prostate cancer screening: some controversy, starts usually at 51: Rectal exam and PSA.  Aortic Abdominal Aneurism once between 66 and 72 years old if ever smoker.  Also recommended:  1. Dental visit- Brush and floss your teeth twice daily; visit your dentist twice a year. 2. Eye doctor- Get an eye exam at least every 2 years. 3. Helmet use- Always wear a helmet when riding a bicycle, motorcycle, rollerblading or skateboarding. 4. Safe sex- If you may be exposed to sexually transmitted infections, use a condom. 5. Seat belts- Seat belts can save your live; always wear one. 6. Smoke/Carbon Monoxide detectors- These detectors need to be installed on the appropriate level of your home. Replace batteries at least once a  year. 7. Skin cancer- When out in the sun please cover up and use sunscreen 15 SPF or higher. 8. Violence- If anyone is threatening or hurting you, please tell your healthcare provider.  9. Drink alcohol in moderation- Limit alcohol intake to one drink or less per day. Never drink and drive.  Please be sure medication list is accurate. If a new problem present, please set up appointment sooner than planned today.

## 2019-06-25 NOTE — Progress Notes (Signed)
HPI:  [name] is a 68 y.o.male here today for his routine physical examination.  Last CPE: 11/12/17 He lives with a male friend. She moved with her and sold his house, planning on buying a new place.  Regular exercise 3 or more times per week: Walks daily 2 miles daily and plays golf. Following a healthy diet: Yes, no more fast foods,he cooks his meals.   Chronic medical problems: CVA,HLD,HTN,BPH with nocturia.  Immunization History  Administered Date(s) Administered   Influenza-Unspecified 05/17/2014, 05/28/2017   Td 11/15/2007    -Hep C screening (if born [birth year]): 08/19/15 NR  Last colon cancer screening: He was referred last year,could not set up appt, he would like a ne referral. Last prostate ca screening: 10/2017 PSA normal at 0.9. Nocturia x 1-2,depending of amount of fluids he drinks. -Denies high alcohol intake or Hx of illicit drug use. Former smoker.  -Concerns and/or follow up today:   HTN: Currently he is on Coreg 6.25 mg bid,Enalapril 20 mg daily,and Amlodipine 5 mg 1/2 tab bid. He is not checking BP at home.  CVA (hemorrhagic)-HLD, he is not longer taking statin medication. Following low fat diet. Vit D deficiency: He is on vit D 400 U daily.  Review of Systems  Constitutional: Negative for activity change, appetite change, fatigue, fever and unexpected weight change.  HENT: Negative for dental problem, nosebleeds, sore throat, trouble swallowing and voice change.   Eyes: Negative for redness and visual disturbance.  Respiratory: Negative for cough, shortness of breath and wheezing.   Cardiovascular: Negative for chest pain, palpitations and leg swelling.  Gastrointestinal: Negative for abdominal pain, blood in stool, nausea and vomiting.  Endocrine: Negative for cold intolerance, heat intolerance, polydipsia, polyphagia and polyuria.  Genitourinary: Negative for decreased urine volume, dysuria, genital sores, hematuria and  testicular pain.  Musculoskeletal: Negative for gait problem and myalgias.  Skin: Negative for color change and rash.  Neurological: Negative for dizziness, syncope, weakness, numbness and headaches.  Hematological: Negative for adenopathy. Does not bruise/bleed easily.  Psychiatric/Behavioral: Negative for confusion and sleep disturbance. The patient is not nervous/anxious.   All other systems reviewed and are negative.    Current Outpatient Medications on File Prior to Visit  Medication Sig Dispense Refill   amLODipine (NORVASC) 5 MG tablet Take 0.5 tablets (2.5 mg total) by mouth 2 (two) times daily. 180 tablet 1   enalapril (VASOTEC) 20 MG tablet TAKE 1 TABLET (20 MG TOTAL) BY MOUTH DAILY. 90 tablet 2   traMADol (ULTRAM) 50 MG tablet Take 1 tablet (50 mg total) by mouth every 6 (six) hours as needed. 25 tablet 0   vitamin C (ASCORBIC ACID) 500 MG tablet Take 1,000 mg by mouth daily.      Vitamin D, Cholecalciferol, 10 MCG (400 UNIT) CAPS Take 400 Units by mouth daily.     vitamin E 400 UNIT capsule Take 400 Units by mouth 2 (two) times a week.      carvedilol (COREG) 6.25 MG tablet TAKE 1 TABLET TWICE DAILY WITH MEALS (NEED TO SCHEDULE PHYSICAL EXAM) 180 tablet 0   No current facility-administered medications on file prior to visit.      Past Medical History:  Diagnosis Date   CVA (cerebral vascular accident) ([place]) 2019   Diverticulosis    Hyperlipidemia    Hypertension    Internal hemorrhoid    Vitamin D deficiency     Past Surgical History:  Procedure Laterality Date   INGUINAL HERNIA  REPAIR     NASAL SEPTUM SURGERY     TONSILLECTOMY     VENTRAL HERNIA REPAIR N/A 05/08/2019   Procedure: VENTRAL HERNIA REPAIR;  Surgeon: Coralie Keens, MD;  Location: [facility];  Service: General;  Laterality: N/A;  LMA    Allergies  Allergen Reactions   Penicillins Other (See Comments)    Passes out Has patient had a PCN reaction causing immediate rash,  facial/tongue/throat swelling, SOB or lightheadedness with hypotension: No Has patient had a PCN reaction causing severe rash involving mucus membranes or skin necrosis: No Has patient had a PCN reaction that required hospitalization: No Has patient had a PCN reaction occurring within the last 10 years: No If all of the above answers are "NO", then may proceed with Cephalosporin use.    Family History  Problem Relation Age of Onset   Stroke Father    Cancer Mother        Brain   Heart attack Brother     Social History   Socioeconomic History   Marital status: Single    Spouse name: Not on file   Number of children: Not on file   Years of education: Not on file   Highest education level: Not on file  Occupational History   Not on file  Social Needs   Financial resource strain: Not on file   Food insecurity    Worry: Not on file    Inability: Not on file   Transportation needs    Medical: Not on file    Non-medical: Not on file  Tobacco Use   Smoking status: Former Smoker    Packs/day: 0.50    Years: 4.00    Pack years: 2.00    Start date: 08/29/1967    Quit date: 08/29/1971    Years since quitting: 47.8   Smokeless tobacco: Never Used   Tobacco comment: discussed AAA and agreed to fup   Substance and Sexual Activity   Alcohol use: No   Drug use: No   Sexual activity: Not on file  Lifestyle   Physical activity    Days per week: Not on file    Minutes per session: Not on file   Stress: Not on file  Relationships   Social connections    Talks on phone: Not on file    Gets together: Not on file    Attends religious service: Not on file    Active member of club or organization: Not on file    Attends meetings of clubs or organizations: Not on file    Relationship status: Not on file  Other Topics Concern   Not on file  Social History Narrative   Not on file     Vitals:   06/25/19 0935  BP: 124/70  Pulse: (!) 55  Resp: 12  Temp: (!)  97.2 F (36.2 C)  SpO2: 97%   Body mass index is 22.84 kg/m.   Wt Readings from Last 3 Encounters:  06/25/19 159 lb 3.2 oz (72.2 kg)  05/06/19 159 lb 1.6 oz (72.2 kg)  01/10/19 160 lb 4 oz (72.7 kg)    Physical Exam  Nursing note and vitals reviewed. Constitutional: He is oriented to person, place, and time. He appears well-developed and well-nourished. No distress.  HENT:  Head: Atraumatic.  Right Ear: Tympanic membrane, external ear and ear canal normal.  Left Ear: Tympanic membrane, external ear and ear canal normal.  Mouth/Throat: Oropharynx is clear and moist and mucous membranes are normal.  Eyes: Pupils are equal, round, and reactive to light. Conjunctivae and EOM are normal.  Neck: Normal range of motion. No tracheal deviation present. No thyromegaly present.  Cardiovascular: Normal rate and regular rhythm.  No murmur heard. Pulses:      Dorsalis pedis pulses are 2+ on the right side and 2+ on the left side.  HR by my count 60/min.  Respiratory: Effort normal and breath sounds normal. No respiratory distress.  GI: Soft. He exhibits no mass. There is no hepatomegaly. There is no abdominal tenderness.  Genitourinary:    Genitourinary Comments: Refused,no concerns.   Musculoskeletal:        General: No tenderness or edema.     Comments: No major deformities appreciated and no signs of synovitis.  Lymphadenopathy:    He has no cervical adenopathy.       Right: No supraclavicular adenopathy present.       Left: No supraclavicular adenopathy present.  Neurological: He is alert and oriented to person, place, and time. He has normal strength. No cranial nerve deficit or sensory deficit. Coordination and gait normal.  Reflex Scores:      Bicep reflexes are 2+ on the right side and 2+ on the left side.      Patellar reflexes are 2+ on the right side and 2+ on the left side. Skin: Skin is warm. No erythema.  Psychiatric: He has a normal mood and affect. Cognition and memory  are normal.  Well groomed,good eye contact.    ASSESSMENT AND PLAN:  Mr. Alfreda was seen today for annual exam.  Diagnoses and all orders for this visit:  Orders Placed This Encounter  Procedures   Comprehensive metabolic panel   PSA(Must document that pt has been informed of limitations of PSA testing.)   Lipid panel   Ambulatory referral to Gastroenterology   Lab Results  Component Value Date   CREATININE 0.99 06/25/2019   BUN 16 06/25/2019   NA 139 06/25/2019   K 3.8 06/25/2019   CL 101 06/25/2019   CO2 31 06/25/2019   Lab Results  Component Value Date   ALT 10 06/25/2019   AST 14 06/25/2019   ALKPHOS 87 06/25/2019   BILITOT 0.7 06/25/2019   Lab Results  Component Value Date   CHOL 174 06/25/2019   HDL 53.40 06/25/2019   LDLCALC 103 (H) 06/25/2019   TRIG 87.0 06/25/2019   CHOLHDL 3 06/25/2019   Lab Results  Component Value Date   PSA 0.90 06/25/2019    Routine general medical examination at a health care facility We discussed the importance of regular physical activity and healthy diet for prevention of chronic illness and/or complications. Preventive guidelines reviewed. Vaccination: Refused pneumonia. He already received flu vaccine.  Next CPE in a year.  Essential hypertension BP adequately controlled. No changes in current management.  -     Comprehensive metabolic panel  Hyperlipidemia, unspecified hyperlipidemia type He is not interested in pharmacologic treatment. Benefits of statins discussed.  -     Comprehensive metabolic panel -     Lipid panel  BPH associated with nocturia Stable symptom.  -     PSA(Must document that pt has been informed of limitations of PSA testing.)  Colon cancer screening -     Ambulatory referral to Gastroenterology   Return in 6 months (on 12/24/2019).    Reginaldo Hazard G. Martinique, MD  [facility]. [place] office.

## 2019-06-26 ENCOUNTER — Telehealth: Payer: Self-pay

## 2019-06-26 LAB — PSA: PSA: 0.9 ng/mL (ref 0.10–4.00)

## 2019-06-26 NOTE — Telephone Encounter (Signed)
Copied from [place] [id number]. Topic: General - Inquiry >> Jun 26, 2019 11:15 AM Reyne Dumas L wrote: Reason for CRM:   Pt was told yesterday by Dr. Martinique that he should get a pneumonia shot.  Pt wants to know if this is covered by insurance. Pt can be reached at [phone]

## 2019-06-27 ENCOUNTER — Telehealth: Payer: Self-pay | Admitting: *Deleted

## 2019-06-27 NOTE — Telephone Encounter (Signed)
Attempted to contact patient no answer. LVM for patient informing him to contact his insurance company about vaccine coverage.

## 2019-06-28 ENCOUNTER — Encounter: Payer: Self-pay | Admitting: Family Medicine

## 2019-06-28 MED ORDER — AMLODIPINE BESYLATE 5 MG PO TABS: 2.5000 mg | ORAL_TABLET | Freq: Two times a day (BID) | ORAL | 1 refills | Status: DC

## 2019-07-02 ENCOUNTER — Encounter: Payer: Self-pay | Admitting: Gastroenterology

## 2019-07-05 ENCOUNTER — Ambulatory Visit: Payer: Medicare HMO

## 2019-07-08 ENCOUNTER — Telehealth: Payer: Self-pay | Admitting: *Deleted

## 2019-07-08 NOTE — Telephone Encounter (Signed)
Copied from [place] [id number]. Topic: General - Inquiry >> Jul 02, 2019 10:09 AM Richardo Priest, NT wrote: Reason for CRM: Pt called in stating it is unacceptable that the office has not returned any calls from his insurance company to go ahead and approve prescription for carvedilol (COREG) 6.25 MG tablet. Pt stated they have been trying to get an answer for days with no response. Pt is highly upset and wants a call back when it is finished with. Please advise. >> Jul 02, 2019 10:34 AM Miles Costain T, CMA wrote: Hulen Skains and spoke to [facility].  Carvedilol was received on 06/26/2019 but cannot be mailed until 07/04/2019.  Last filled in August.  Pt needs to be notified.  Left a message for a return call.

## 2019-07-09 DIAGNOSIS — L821 Other seborrheic keratosis: Secondary | ICD-10-CM | POA: Diagnosis not present

## 2019-07-09 DIAGNOSIS — L570 Actinic keratosis: Secondary | ICD-10-CM | POA: Diagnosis not present

## 2019-07-11 ENCOUNTER — Ambulatory Visit: Payer: Medicare HMO

## 2019-08-05 ENCOUNTER — Encounter: Payer: Medicare HMO | Admitting: Gastroenterology

## 2019-08-13 ENCOUNTER — Inpatient Hospital Stay (HOSPITAL_COMMUNITY): Admission: RE | Admit: 2019-08-13 | Payer: Medicare HMO | Source: Ambulatory Visit

## 2019-08-18 ENCOUNTER — Ambulatory Visit (HOSPITAL_COMMUNITY)
Admission: RE | Admit: 2019-08-18 | Payer: Medicare HMO | Source: Ambulatory Visit | Attending: Family Medicine | Admitting: Family Medicine

## 2019-08-29 ENCOUNTER — Other Ambulatory Visit: Payer: Self-pay | Admitting: Family Medicine

## 2019-09-19 ENCOUNTER — Ambulatory Visit: Payer: Medicare HMO | Attending: Internal Medicine

## 2019-09-19 DIAGNOSIS — Z23 Encounter for immunization: Secondary | ICD-10-CM | POA: Insufficient documentation

## 2019-09-19 NOTE — Progress Notes (Signed)
   Covid-19 Vaccination Clinic  Name:  [name]    MRN: [id number] DOB: May 28, 1951  09/19/2019  [name] was observed post Covid-19 immunization for 15 minutes without incidence. He was provided with Vaccine Information Sheet and instruction to access the V-Safe system.   [name] was instructed to call 911 with any severe reactions post vaccine: Marland Kitchen Difficulty breathing  . Swelling of your face and throat  . A fast heartbeat  . A bad rash all over your body  . Dizziness and weakness    Immunizations Administered    Name Date Dose VIS Date Route   Pfizer COVID-19 Vaccine 09/19/2019  4:04 PM 0.3 mL 08/08/2019 Intramuscular   Manufacturer: [place]   Lot: [id number]   [place]: [id number]

## 2019-09-29 ENCOUNTER — Ambulatory Visit: Payer: Medicare HMO | Admitting: Family Medicine

## 2019-10-10 ENCOUNTER — Ambulatory Visit: Payer: Medicare HMO | Attending: Internal Medicine

## 2019-10-10 DIAGNOSIS — Z23 Encounter for immunization: Secondary | ICD-10-CM

## 2019-10-10 NOTE — Progress Notes (Signed)
   Covid-19 Vaccination Clinic  Name:  [name]    MRN: [id number] DOB: October 14, 1950  10/10/2019  [name] was observed post Covid-19 immunization for 30 minutes based on pre-vaccination screening without incidence. He was provided with Vaccine Information Sheet and instruction to access the V-Safe system.   [name] was instructed to call 911 with any severe reactions post vaccine: Marland Kitchen Difficulty breathing  . Swelling of your face and throat  . A fast heartbeat  . A bad rash all over your body  . Dizziness and weakness    Immunizations Administered    Name Date Dose VIS Date Route   Pfizer COVID-19 Vaccine 10/10/2019 12:45 PM 0.3 mL 08/08/2019 Intramuscular   Manufacturer: [place]   Lot: [id number]   [place]: [id number]

## 2019-11-27 ENCOUNTER — Other Ambulatory Visit: Payer: Self-pay

## 2019-11-27 ENCOUNTER — Ambulatory Visit (HOSPITAL_COMMUNITY)
Admission: RE | Admit: 2019-11-27 | Discharge: 2019-11-27 | Disposition: A | Discharge status: Home / Self Care | Payer: Medicare HMO | Source: Ambulatory Visit | Attending: Cardiology | Admitting: Cardiology

## 2019-11-27 DIAGNOSIS — I6529 Occlusion and stenosis of unspecified carotid artery: Secondary | ICD-10-CM | POA: Diagnosis not present

## 2019-12-07 ENCOUNTER — Encounter: Payer: Self-pay | Admitting: Family Medicine

## 2019-12-08 ENCOUNTER — Other Ambulatory Visit: Payer: Self-pay

## 2019-12-09 ENCOUNTER — Encounter: Payer: Self-pay | Admitting: Family Medicine

## 2019-12-09 ENCOUNTER — Ambulatory Visit (INDEPENDENT_AMBULATORY_CARE_PROVIDER_SITE_OTHER): Payer: Medicare HMO | Admitting: Family Medicine

## 2019-12-09 ENCOUNTER — Other Ambulatory Visit: Payer: Self-pay

## 2019-12-09 VITALS — BP 156/96 | HR 65 | Temp 97.1°F | Resp 12 | Ht 70.0 in | Wt 162.0 lb

## 2019-12-09 DIAGNOSIS — J302 Other seasonal allergic rhinitis: Secondary | ICD-10-CM

## 2019-12-09 DIAGNOSIS — E785 Hyperlipidemia, unspecified: Secondary | ICD-10-CM | POA: Diagnosis not present

## 2019-12-09 DIAGNOSIS — I739 Peripheral vascular disease, unspecified: Secondary | ICD-10-CM | POA: Diagnosis not present

## 2019-12-09 DIAGNOSIS — I10 Essential (primary) hypertension: Secondary | ICD-10-CM | POA: Diagnosis not present

## 2019-12-09 MED ORDER — AMLODIPINE BESYLATE 5 MG PO TABS: 5.0000 mg | ORAL_TABLET | Freq: Every day | ORAL | 1 refills | Status: DC

## 2019-12-09 NOTE — Progress Notes (Signed)
Chief Complaint  Patient presents with  . Follow-up    Discuss BP medication  . Eye Problem    Patient want to have his left eye checked    HPI:  [name] is a 69 y.o. male, who is here today for chronic disease management.Marland Kitchen   He was last seen on 06/24/20.  HTN: He is on Amlodipine 5 mg daily,Enalapril 20 mg daily,and Carvedilol 6.25 mg bid. He wants to gp thought meds and see if he still needs to take them all.  Lab Results  Component Value Date   CREATININE 0.99 06/25/2019   BUN 16 06/25/2019   NA 139 06/25/2019   K 3.8 06/25/2019   CL 101 06/25/2019   CO2 31 06/25/2019   Negative for severe/frequent headache, visual changes, chest pain, dyspnea, palpitation, claudication, focal weakness, or edema.  Somebody is cooking for him and he is suspecting she is adding a lot of salt to his food.  Home BP's 140's/80's.  HLD: He is not longer on statin medication. CVA, hemorrhagic.  Lab Results  Component Value Date   CHOL 174 06/25/2019   HDL 53.40 06/25/2019   LDLCALC 103 (H) 06/25/2019   TRIG 87.0 06/25/2019   CHOLHDL 3 06/25/2019   PAD, carotid artery stenosis. Carotid duplex 11/27/19: Right Carotid: Velocities in the right ICA are consistent with a 1-39% stenosis. Non-hemodynamically significant plaque <50% noted in the CCA.  Left Carotid: Velocities in the left ICA are consistent with a 1-39% stenosis.Non-hemodynamically significant plaque <50% noted in the CCA.  Vertebrals: Bilateral vertebral arteries demonstrate antegrade flow.  Right vertebral artery demonstrates high resistant flow.  Subclavians: Normal flow hemodynamics were seen in bilateral subclavian arteries.    Left eye tearing and mild pruritus.  No conjunctival erythema,yellowish drainage,or visual changes. He thinks it is caused by looking down his phone all the time, "eye muscle balance is out of wack" He has not tried OTC medication.   Review of Systems  Constitutional:  Negative for activity change, appetite change, fatigue and fever.  HENT: Negative for mouth sores, nosebleeds and sore throat.   Eyes: Negative for photophobia and pain.  Respiratory: Negative for cough and wheezing.   Gastrointestinal: Negative for abdominal pain, nausea and vomiting.  Genitourinary: Negative for decreased urine volume and hematuria.  Musculoskeletal: Negative for gait problem and myalgias.  Skin: Negative for pallor and rash.  Neurological: Negative for syncope and facial asymmetry.  Psychiatric/Behavioral: Negative for confusion.  Rest of ROS, see pertinent positives sand negatives in HPI  Current Outpatient Medications on File Prior to Visit  Medication Sig Dispense Refill  . carvedilol (COREG) 6.25 MG tablet Take 1 tablet (6.25 mg total) by mouth 2 (two) times daily with a meal. 180 tablet 3  . vitamin C (ASCORBIC ACID) 500 MG tablet Take 1,000 mg by mouth daily.     . Vitamin D, Cholecalciferol, 10 MCG (400 UNIT) CAPS Take 400 Units by mouth daily.    . vitamin E 400 UNIT capsule Take 400 Units by mouth 2 (two) times a week.      No current facility-administered medications on file prior to visit.   Past Medical History:  Diagnosis Date  . CVA (cerebral vascular accident) ([place]) 2019  . Diverticulosis   . Hyperlipidemia   . Hypertension   . Internal hemorrhoid   . Vitamin D deficiency    Allergies  Allergen Reactions  . Penicillins Other (See Comments)    Passes out Has patient  had a PCN reaction causing immediate rash, facial/tongue/throat swelling, SOB or lightheadedness with hypotension: No Has patient had a PCN reaction causing severe rash involving mucus membranes or skin necrosis: No Has patient had a PCN reaction that required hospitalization: No Has patient had a PCN reaction occurring within the last 10 years: No If all of the above answers are "NO", then may proceed with Cephalosporin use.    Social History   Socioeconomic History  . Marital  status: Single    Spouse name: Not on file  . Number of children: Not on file  . Years of education: Not on file  . Highest education level: Not on file  Occupational History  . Not on file  Tobacco Use  . Smoking status: Former Smoker    Packs/day: 0.50    Years: 4.00    Pack years: 2.00    Start date: 08/29/1967    Quit date: 08/29/1971    Years since quitting: 48.3  . Smokeless tobacco: Never Used  . Tobacco comment: discussed AAA and agreed to fup   Substance and Sexual Activity  . Alcohol use: No  . Drug use: No  . Sexual activity: Not on file  Other Topics Concern  . Not on file  Social History Narrative  . Not on file   Social Determinants of Health   Financial Resource Strain:   . Difficulty of Paying Living Expenses:   Food Insecurity:   . Worried About Charity fundraiser in the Last Year:   . Arboriculturist in the Last Year:   Transportation Needs:   . Film/video editor (Medical):   Marland Kitchen Lack of Transportation (Non-Medical):   Physical Activity:   . Days of Exercise per Week:   . Minutes of Exercise per Session:   Stress:   . Feeling of Stress :   Social Connections:   . Frequency of Communication with Friends and Family:   . Frequency of Social Gatherings with Friends and Family:   . Attends Religious Services:   . Active Member of Clubs or Organizations:   . Attends Archivist Meetings:   Marland Kitchen Marital Status:     Vitals:   12/09/19 1200  BP: (!) 156/96  Pulse: 65  Resp: 12  Temp: (!) 97.1 F (36.2 C)  SpO2: 98%   Wt Readings from Last 3 Encounters:  12/09/19 162 lb (73.5 kg)  06/25/19 159 lb 3.2 oz (72.2 kg)  05/06/19 159 lb 1.6 oz (72.2 kg)   Body mass index is 23.24 kg/m.   Physical Exam  Nursing note and vitals reviewed. Constitutional: He is oriented to person, place, and time. He appears well-developed and well-nourished. No distress.  HENT:  Head: Normocephalic and atraumatic.  Mouth/Throat: Oropharynx is clear and  moist and mucous membranes are normal.  Eyes: Pupils are equal, round, and reactive to light. Conjunctivae are normal.  Cardiovascular: Normal rate and regular rhythm.  No murmur heard. Pulses:      Dorsalis pedis pulses are 2+ on the right side and 2+ on the left side.  Respiratory: Effort normal and breath sounds normal. No respiratory distress.  GI: Soft. He exhibits no mass. There is no hepatomegaly. There is no abdominal tenderness.  Musculoskeletal:        General: No edema.  Lymphadenopathy:    He has no cervical adenopathy.  Neurological: He is alert and oriented to person, place, and time. He has normal strength. No cranial nerve deficit. Gait  normal.  Skin: Skin is warm. No rash noted. No erythema.  Psychiatric: His mood appears anxious.  Well groomed, good eye contact.    ASSESSMENT AND PLAN:  [name] was seen today for chronic disease management.  1. PAD (peripheral artery disease) ([place]) He does not want to resume statin medication for now. Carotid duplex no significant changes when compared with last one. Because hemorrhagic stroke, he cannot take antiplatelet agent. Instructed about warning signs.  2. Essential hypertension BP is not adequately controlled. Re-checked: 160/90 LUE. We discussed possible complications of elevated BP. For now he does not want to change meds doses. Low salt diet recommended. Continue monitoring BP regularly.  - amLODipine (NORVASC) 5 MG tablet; Take 1 tablet (5 mg total) by mouth at bedtime.  Dispense: 180 tablet; Refill: 1  3. Hyperlipidemia, unspecified hyperlipidemia type We discussed benefits of stain meds in regard to CVD prevention.  4. Seasonal allergies OTC oral or topical antihistaminic will help. He prefers to hold on adding medication. He is going to work on identified trigger factors.  He doe snot want labs today.  Return in about 3 months (around 03/09/2020) for CPE and f/u.    Jaedynn Bohlken G. Martinique,  MD  [facility]. [place] office.   A few things to remember from today's visit:  Blood pressure should less than 140/90. Low salt diet. A Consider taking cholesterol medication.   Please be sure medication list is accurate. If a new problem present, please set up appointment sooner than planned today.

## 2019-12-09 NOTE — Patient Instructions (Addendum)
A few things to remember from today's visit:  Blood pressure should less than 140/90. Low salt diet. A Consider taking cholesterol medication.   Please be sure medication list is accurate. If a new problem present, please set up appointment sooner than planned today.

## 2019-12-11 MED ORDER — ENALAPRIL MALEATE 20 MG PO TABS: 20.0000 mg | ORAL_TABLET | Freq: Every day | ORAL | 2 refills | Status: DC

## 2019-12-15 ENCOUNTER — Telehealth: Payer: Self-pay | Admitting: Family Medicine

## 2019-12-15 NOTE — Telephone Encounter (Signed)
[name] with patient and he stated that his blood pressure and weight has come down since office visit. Patient stated that before his appointment he went to a birthday party and was eating lots of different foods and he believes that is what contributed to his elevated blood pressure at time of visit. Patient stated that blood pressure today was 142/85.

## 2019-12-15 NOTE — Telephone Encounter (Signed)
pt says his  BP is better and  more stable pt would like a call to talk about what Dr. Martinique told him last  pleas e call  [phone]

## 2019-12-16 ENCOUNTER — Telehealth: Payer: Self-pay | Admitting: Family Medicine

## 2019-12-16 NOTE — Telephone Encounter (Signed)
Please advise 

## 2019-12-16 NOTE — Telephone Encounter (Signed)
Pt is calling in wanting to have blood work done but there is no lab orders for the pt to do so.  Can we have orders to have labs done?

## 2019-12-18 ENCOUNTER — Ambulatory Visit: Payer: Medicare HMO | Admitting: Adult Health

## 2019-12-20 ENCOUNTER — Other Ambulatory Visit: Payer: Self-pay | Admitting: Family Medicine

## 2019-12-20 DIAGNOSIS — E785 Hyperlipidemia, unspecified: Secondary | ICD-10-CM

## 2019-12-20 DIAGNOSIS — I10 Essential (primary) hypertension: Secondary | ICD-10-CM

## 2019-12-20 DIAGNOSIS — E559 Vitamin D deficiency, unspecified: Secondary | ICD-10-CM

## 2019-12-20 NOTE — Telephone Encounter (Signed)
Lab orders placed, he can come a week before appointment. I did not order PSA, if he wants prostate cancer screening, this can be added to blood work. Thanks, BJ

## 2019-12-22 NOTE — Telephone Encounter (Signed)
Left detailed message informing patient

## 2019-12-24 NOTE — Telephone Encounter (Signed)
Patient notified

## 2020-03-02 ENCOUNTER — Telehealth: Payer: Self-pay | Admitting: Family Medicine

## 2020-03-02 DIAGNOSIS — I10 Essential (primary) hypertension: Secondary | ICD-10-CM

## 2020-03-02 NOTE — Telephone Encounter (Signed)
Pt is calling stating he would appreciate if the office would respond to [employer]'s request about his blood pressure medication being changed from 5 MG to 2.5 MG.

## 2020-03-05 MED ORDER — AMLODIPINE BESYLATE 2.5 MG PO TABS: 2.5000 mg | ORAL_TABLET | Freq: Two times a day (BID) | ORAL | 2 refills | Status: DC

## 2020-03-05 NOTE — Telephone Encounter (Signed)
Rx changed and sent into [employer].

## 2020-03-05 NOTE — Telephone Encounter (Signed)
According to med list he is on Amlodipine 5 mg daily. If he is taking 1/2 tab bid (2.5 mg bid) we may have a problem with health insurance to cover 60 tabs per months because it is a qd med. So it is better to cut it in 1/2. If he wants to changes to Amlodipine 2.5 mg,it is ok to changes Rx. Continue monitoring BP,it was elevated last visit. Thanks, BJ

## 2020-03-05 NOTE — Addendum Note (Signed)
Addended by: Rodrigo Ran on: 03/05/2020 08:05 AM   Modules accepted: Orders

## 2020-03-30 ENCOUNTER — Telehealth: Payer: Self-pay | Admitting: Family Medicine

## 2020-03-30 NOTE — Telephone Encounter (Signed)
Left message for patient to schedule Annual Wellness Visit.  Please schedule with Nurse Health Advisor Shannon Crews, RN at [facility]  

## 2020-04-05 DIAGNOSIS — L821 Other seborrheic keratosis: Secondary | ICD-10-CM | POA: Diagnosis not present

## 2020-04-05 DIAGNOSIS — D485 Neoplasm of uncertain behavior of skin: Secondary | ICD-10-CM | POA: Diagnosis not present

## 2020-04-05 DIAGNOSIS — L570 Actinic keratosis: Secondary | ICD-10-CM | POA: Diagnosis not present

## 2020-04-05 DIAGNOSIS — D3613 Benign neoplasm of peripheral nerves and autonomic nervous system of lower limb, including hip: Secondary | ICD-10-CM | POA: Diagnosis not present

## 2020-04-05 DIAGNOSIS — L820 Inflamed seborrheic keratosis: Secondary | ICD-10-CM | POA: Diagnosis not present

## 2020-05-06 ENCOUNTER — Encounter: Payer: Self-pay | Admitting: Gastroenterology

## 2020-05-27 ENCOUNTER — Other Ambulatory Visit: Payer: Self-pay | Admitting: Family Medicine

## 2020-06-03 ENCOUNTER — Other Ambulatory Visit: Payer: Self-pay | Admitting: Family Medicine

## 2020-06-15 ENCOUNTER — Encounter: Payer: Self-pay | Admitting: Gastroenterology

## 2020-06-25 ENCOUNTER — Encounter: Payer: Self-pay | Admitting: Family Medicine

## 2020-06-25 ENCOUNTER — Other Ambulatory Visit: Payer: Self-pay

## 2020-06-25 ENCOUNTER — Ambulatory Visit (INDEPENDENT_AMBULATORY_CARE_PROVIDER_SITE_OTHER): Payer: Medicare HMO | Admitting: Family Medicine

## 2020-06-25 VITALS — BP 126/80 | HR 64 | Resp 16 | Ht 70.0 in | Wt 160.0 lb

## 2020-06-25 DIAGNOSIS — Z23 Encounter for immunization: Secondary | ICD-10-CM

## 2020-06-25 DIAGNOSIS — E785 Hyperlipidemia, unspecified: Secondary | ICD-10-CM

## 2020-06-25 DIAGNOSIS — R351 Nocturia: Secondary | ICD-10-CM

## 2020-06-25 DIAGNOSIS — E559 Vitamin D deficiency, unspecified: Secondary | ICD-10-CM

## 2020-06-25 DIAGNOSIS — I10 Essential (primary) hypertension: Secondary | ICD-10-CM | POA: Diagnosis not present

## 2020-06-25 DIAGNOSIS — Z0000 Encounter for general adult medical examination without abnormal findings: Secondary | ICD-10-CM | POA: Diagnosis not present

## 2020-06-25 DIAGNOSIS — N401 Enlarged prostate with lower urinary tract symptoms: Secondary | ICD-10-CM | POA: Diagnosis not present

## 2020-06-25 LAB — PSA: PSA: 0.81 ng/mL (ref <=4.0)

## 2020-06-25 NOTE — Progress Notes (Signed)
HPI:  [name] is a pleasant 69 y.o.male here today for his routine physical examination.  Last CPE: 06/25/19 He lives with his friend Theatre stage manager. Regular exercise 3 or more times per week: Rides his bike and walks daily 3-5 miles.Plays golf once per week and swing 4 times per week. He also does yoga. Following a healthy diet: He has not been consistent lately.  Chronic medical problems: PAD, hypertension, hyperlipidemia, CVA, vitamin D deficiency, and BPH among some.  Immunization History  Administered Date(s) Administered  . Fluad Quad(high Dose 65+) 06/25/2020  . Influenza, High Dose Seasonal PF 05/26/2017, 11/28/2018, 06/19/2019  . Influenza, Quadrivalent, Recombinant, Inj, Pf 06/21/2018  . Influenza-Unspecified 05/17/2014, 05/28/2017  . PFIZER SARS-COV-2 Vaccination 09/19/2019, 10/10/2019  . Pneumococcal Conjugate-13 11/28/2018  . Td 11/15/2007   -Hep C screening: 08/19/15 NR.  Last colon cancer screening: Cologuard normal on 12/11/2017.  He has an appointment with GI to discuss colonoscopy. PSA 0.9 on 06/25/2019. Nocturia 1-3 times per night, depending of amount of fluids he drinks.  Negative for high alcohol intake. Former smoker.  -Concerns and/or follow up today:  Hyperlipidemia: Currently he is not on statin medication. He follows a low-fat diet.  Lab Results  Component Value Date   CHOL 174 06/25/2019   HDL 53.40 06/25/2019   LDLCALC 103 (H) 06/25/2019   TRIG 87.0 06/25/2019   CHOLHDL 3 06/25/2019   Hypertension: Currently he is on enalapril 20 mg daily, carvedilol 6.25 mg twice daily, and amlodipine 2.5 mg daily. Negative for CP, dyspnea, or edema.  Lab Results  Component Value Date   CREATININE 0.99 06/25/2019   BUN 16 06/25/2019   NA 139 06/25/2019   K 3.8 06/25/2019   CL 101 06/25/2019   CO2 31 06/25/2019   In the past few days he has noted left lower eyelid "dropped." There is no pruritus or pain. Negative for visual changes. It  seems to be worse in the morning and has not changed since tyme of onset. He has an appointment with eye care provider in 06/2020.  Review of Systems  Constitutional: Negative for activity change, appetite change, fatigue and fever.  HENT: Negative for dental problem, nosebleeds and sore throat.   Eyes: Negative for pain and redness.  Respiratory: Negative for cough, shortness of breath and wheezing.   Cardiovascular: Negative for chest pain, palpitations and leg swelling.  Gastrointestinal: Negative for abdominal pain, blood in stool, nausea and vomiting.  Endocrine: Negative for cold intolerance, heat intolerance, polydipsia, polyphagia and polyuria.  Genitourinary: Negative for decreased urine volume, dysuria, genital sores, hematuria and testicular pain.  Musculoskeletal: Negative for arthralgias, back pain, joint swelling and myalgias.  Skin: Negative for color change and rash.  Allergic/Immunologic: Positive for environmental allergies.  Neurological: Negative for dizziness, syncope, weakness and headaches.  Hematological: Negative for adenopathy. Does not bruise/bleed easily.  Psychiatric/Behavioral: Negative for confusion. The patient is not nervous/anxious.   All other systems reviewed and are negative.  Current Outpatient Medications on File Prior to Visit  Medication Sig Dispense Refill  . amLODipine (NORVASC) 2.5 MG tablet Take 1 tablet (2.5 mg total) by mouth in the morning and at bedtime. 180 tablet 2  . carvedilol (COREG) 6.25 MG tablet TAKE 1 TABLET TWICE DAILY WITH A MEAL 180 tablet 3  . enalapril (VASOTEC) 20 MG tablet TAKE 1 TABLET BY MOUTH DAILY. 90 tablet 0  . vitamin C (ASCORBIC ACID) 500 MG tablet Take 1,000 mg by mouth daily.     Marland Kitchen  Vitamin D, Cholecalciferol, 10 MCG (400 UNIT) CAPS Take 400 Units by mouth daily.    . vitamin E 400 UNIT capsule Take 400 Units by mouth 2 (two) times a week.      No current facility-administered medications on file prior to visit.      Past Medical History:  Diagnosis Date  . CVA (cerebral vascular accident) ([place]) 2019  . Diverticulosis   . Hyperlipidemia   . Hypertension   . Internal hemorrhoid   . Vitamin D deficiency     Past Surgical History:  Procedure Laterality Date  . INGUINAL HERNIA REPAIR    . NASAL SEPTUM SURGERY    . TONSILLECTOMY    . VENTRAL HERNIA REPAIR N/A 05/08/2019   Procedure: VENTRAL HERNIA REPAIR;  Surgeon: Coralie Keens, MD;  Location: [place];  Service: General;  Laterality: N/A;  LMA    Allergies  Allergen Reactions  . Penicillins Other (See Comments)    Passes out Has patient had a PCN reaction causing immediate rash, facial/tongue/throat swelling, SOB or lightheadedness with hypotension: No Has patient had a PCN reaction causing severe rash involving mucus membranes or skin necrosis: No Has patient had a PCN reaction that required hospitalization: No Has patient had a PCN reaction occurring within the last 10 years: No If all of the above answers are "NO", then may proceed with Cephalosporin use.    Family History  Problem Relation Age of Onset  . Stroke Father   . Cancer Mother        Brain  . Heart attack Brother     Social History   Socioeconomic History  . Marital status: Single    Spouse name: Not on file  . Number of children: Not on file  . Years of education: Not on file  . Highest education level: Not on file  Occupational History  . Not on file  Tobacco Use  . Smoking status: Former Smoker    Packs/day: 0.50    Years: 4.00    Pack years: 2.00    Start date: 08/29/1967    Quit date: 08/29/1971    Years since quitting: 48.8  . Smokeless tobacco: Never Used  . Tobacco comment: discussed AAA and agreed to fup   Vaping Use  . Vaping Use: Never used  Substance and Sexual Activity  . Alcohol use: No  . Drug use: No  . Sexual activity: Not on file  Other Topics Concern  . Not on file  Social History Narrative  . Not on file   Social Determinants  of Health   Financial Resource Strain:   . Difficulty of Paying Living Expenses: Not on file  Food Insecurity:   . Worried About Charity fundraiser in the Last Year: Not on file  . Ran Out of Food in the Last Year: Not on file  Transportation Needs:   . Lack of Transportation (Medical): Not on file  . Lack of Transportation (Non-Medical): Not on file  Physical Activity:   . Days of Exercise per Week: Not on file  . Minutes of Exercise per Session: Not on file  Stress:   . Feeling of Stress : Not on file  Social Connections:   . Frequency of Communication with Friends and Family: Not on file  . Frequency of Social Gatherings with Friends and Family: Not on file  . Attends Religious Services: Not on file  . Active Member of Clubs or Organizations: Not on file  . Attends Club or  Organization Meetings: Not on file  . Marital Status: Not on file   Vitals:   06/25/20 0945  BP: 126/80  Pulse: 64  Resp: 16  SpO2: 99%   Body mass index is 22.96 kg/m.  Wt Readings from Last 3 Encounters:  06/25/20 160 lb (72.6 kg)  12/09/19 162 lb (73.5 kg)  06/25/19 159 lb 3.2 oz (72.2 kg)   Physical Exam Vitals and nursing note reviewed.  Constitutional:      General: He is not in acute distress.    Appearance: He is well-developed and normal weight.  HENT:     Head: Normocephalic and atraumatic.     Right Ear: External ear normal.     Left Ear: External ear normal.     Ears:     Comments: Cerumen excess bilateral,not able to see TM.    Mouth/Throat:     Mouth: Mucous membranes are moist.     Pharynx: Oropharynx is clear.  Eyes:     Extraocular Movements: Extraocular movements intact.     Conjunctiva/sclera: Conjunctivae normal.     Pupils: Pupils are equal, round, and reactive to light.  Neck:     Thyroid: No thyromegaly.     Trachea: No tracheal deviation.  Cardiovascular:     Rate and Rhythm: Normal rate and regular rhythm.     Pulses:          Dorsalis pedis pulses are 2+ on  the right side and 2+ on the left side.     Heart sounds: No murmur heard.   Pulmonary:     Effort: Pulmonary effort is normal. No respiratory distress.     Breath sounds: Normal breath sounds.  Abdominal:     Palpations: Abdomen is soft. There is no hepatomegaly or mass.     Tenderness: There is no abdominal tenderness.  Genitourinary:    Prostate: Enlarged. Not tender and no nodules present.     Comments: Refused,no concerns. Musculoskeletal:        General: No tenderness.     Cervical back: Normal range of motion.     Comments: No major deformities appreciated and no signs of synovitis.  Lymphadenopathy:     Cervical: No cervical adenopathy.     Upper Body:     Right upper body: No supraclavicular adenopathy.     Left upper body: No supraclavicular adenopathy.  Skin:    General: Skin is warm.     Findings: No erythema.  Neurological:     General: No focal deficit present.     Mental Status: He is alert and oriented to person, place, and time.     Cranial Nerves: No cranial nerve deficit.     Sensory: No sensory deficit.     Coordination: Coordination normal.     Gait: Gait normal.     Deep Tendon Reflexes:     Reflex Scores:      Bicep reflexes are 2+ on the right side and 2+ on the left side.      Patellar reflexes are 2+ on the right side and 2+ on the left side. Psychiatric:        Mood and Affect: Mood is not anxious or depressed.   ASSESSMENT AND PLAN:  ScottSharp was seen today for annual exam.  Diagnoses and all orders for this visit: Orders Placed This Encounter  Procedures  . Flu Vaccine QUAD High Dose(Fluad)  . PSA   Lab Results  Component Value Date   PSA 0.81 06/25/2020  Routine general medical examination at a health care facility Encouraged to continue following a healthful diet and exercising regularly. Preventive guidelines reviewed. Vaccination: Refused Pneumovax.  Next CPE in a year.  Need for influenza vaccination -     Flu Vaccine  QUAD High Dose(Fluad)  BPH associated with nocturia Stable. Further recommendation will be given according to PSA result.  Hypertension BP adequate controlled. Continue current management. Low salt diet. He has eye exam appt in 06/2020.  Vitamin D deficiency Continue vit D 400 U daily. Further recommendations according to 25 OH vit D.  Hyperlipidemia He is not interested in pharmacologic treatment, so continue low fat diet.  Return in 6 months (on 12/24/2020) for HTN.   Caramia Boutin G. Martinique, MD  [facility]. [place] office.  A few things to remember from today's visit:  Need for influenza vaccination - Plan: Flu Vaccine QUAD High Dose(Fluad)  Primary hypertension  Routine general medical examination at a health care facility  Vitamin D deficiency - Plan: VITAMIN D 25 Hydroxy (Vit-D Deficiency, Fractures)  Essential hypertension - Plan: Comprehensive metabolic panel  Hyperlipidemia, unspecified hyperlipidemia type - Plan: Comprehensive metabolic panel, Lipid panel  BPH associated with nocturia - Plan: PSA  No changes today.  If you need refills please call your pharmacy. Do not use My Chart to request refills or for acute issues that need immediate attention.    Please be sure medication list is accurate. If a new problem present, please set up appointment sooner than planned today.  At least 150 minutes of moderate exercise per week, daily brisk walking for 15-30 min is a good exercise option. Healthy diet low in saturated (animal) fats and sweets and consisting of fresh fruits and vegetables, lean meats such as fish and white chicken and whole grains.  - Vaccines:  Tdap vaccine every 10 years.  Shingles vaccine recommended at age 4, could be given after 71 years of age but not sure about insurance coverage.  Pneumonia vaccines: Pneumovax at 66  -Screening recommendations for low/normal risk males:  Screening for diabetes at age 54 and every 3  years. Earlier screening if cardiovascular risk factors.   Lipid screening at 35 and every 3 years. Screening starts in younger males with cardiovascular risk factors.N/A  Colon cancer screening is now at age 71 but your insurance may not cover until age 62 .screening is recommended age 3.  Prostate cancer screening: some controversy, starts usually at 53: Rectal exam and PSA.  Aortic Abdominal Aneurism once between 41 and 32 years old if ever smoker.  Also recommended:  1. Dental visit- Brush and floss your teeth twice daily; visit your dentist twice a year. 2. Eye doctor- Get an eye exam at least every 2 years. 3. Helmet use- Always wear a helmet when riding a bicycle, motorcycle, rollerblading or skateboarding. 4. Safe sex- If you may be exposed to sexually transmitted infections, use a condom. 5. Seat belts- Seat belts can save your live; always wear one. 6. Smoke/Carbon Monoxide detectors- These detectors need to be installed on the appropriate level of your home. Replace batteries at least once a year. 7. Skin cancer- When out in the sun please cover up and use sunscreen 15 SPF or higher. 8. Violence- If anyone is threatening or hurting you, please tell your healthcare provider.  9. Drink alcohol in moderation- Limit alcohol intake to one drink or less per day. Never drink and drive.

## 2020-06-25 NOTE — Assessment & Plan Note (Signed)
BP adequate controlled. Continue current management. Low salt diet. He has eye exam appt in 06/2020.

## 2020-06-25 NOTE — Assessment & Plan Note (Signed)
He is not interested in pharmacologic treatment, so continue low fat diet.

## 2020-06-25 NOTE — Assessment & Plan Note (Signed)
Continue vit D 400 U daily. Further recommendations according to 25 OH vit D.

## 2020-06-25 NOTE — Patient Instructions (Addendum)
A few things to remember from today's visit:  Need for influenza vaccination - Plan: Flu Vaccine QUAD High Dose(Fluad)  Primary hypertension  Routine general medical examination at a health care facility  Vitamin D deficiency - Plan: VITAMIN D 25 Hydroxy (Vit-D Deficiency, Fractures)  Essential hypertension - Plan: Comprehensive metabolic panel  Hyperlipidemia, unspecified hyperlipidemia type - Plan: Comprehensive metabolic panel, Lipid panel  BPH associated with nocturia - Plan: PSA  No changes today.  If you need refills please call your pharmacy. Do not use My Chart to request refills or for acute issues that need immediate attention.    Please be sure medication list is accurate. If a new problem present, please set up appointment sooner than planned today.  At least 150 minutes of moderate exercise per week, daily brisk walking for 15-30 min is a good exercise option. Healthy diet low in saturated (animal) fats and sweets and consisting of fresh fruits and vegetables, lean meats such as fish and white chicken and whole grains.  - Vaccines:  Tdap vaccine every 10 years.  Shingles vaccine recommended at age 52, could be given after 71 years of age but not sure about insurance coverage.  Pneumonia vaccines: Pneumovax at 58  -Screening recommendations for low/normal risk males:  Screening for diabetes at age 15 and every 3 years. Earlier screening if cardiovascular risk factors.   Lipid screening at 35 and every 3 years. Screening starts in younger males with cardiovascular risk factors.N/A  Colon cancer screening is now at age 73 but your insurance may not cover until age 34 .screening is recommended age 78.  Prostate cancer screening: some controversy, starts usually at 72: Rectal exam and PSA.  Aortic Abdominal Aneurism once between 49 and 19 years old if ever smoker.  Also recommended:  1. Dental visit- Brush and floss your teeth twice daily; visit your  dentist twice a year. 2. Eye doctor- Get an eye exam at least every 2 years. 3. Helmet use- Always wear a helmet when riding a bicycle, motorcycle, rollerblading or skateboarding. 4. Safe sex- If you may be exposed to sexually transmitted infections, use a condom. 5. Seat belts- Seat belts can save your live; always wear one. 6. Smoke/Carbon Monoxide detectors- These detectors need to be installed on the appropriate level of your home. Replace batteries at least once a year. 7. Skin cancer- When out in the sun please cover up and use sunscreen 15 SPF or higher. 8. Violence- If anyone is threatening or hurting you, please tell your healthcare provider.  9. Drink alcohol in moderation- Limit alcohol intake to one drink or less per day. Never drink and drive.

## 2020-06-30 ENCOUNTER — Telehealth: Payer: Self-pay | Admitting: Family Medicine

## 2020-06-30 ENCOUNTER — Encounter: Payer: Medicare HMO | Admitting: Gastroenterology

## 2020-06-30 ENCOUNTER — Other Ambulatory Visit: Payer: Self-pay

## 2020-06-30 ENCOUNTER — Other Ambulatory Visit: Payer: Medicare HMO

## 2020-06-30 DIAGNOSIS — I10 Essential (primary) hypertension: Secondary | ICD-10-CM

## 2020-06-30 DIAGNOSIS — E559 Vitamin D deficiency, unspecified: Secondary | ICD-10-CM

## 2020-06-30 DIAGNOSIS — E785 Hyperlipidemia, unspecified: Secondary | ICD-10-CM

## 2020-06-30 NOTE — Telephone Encounter (Signed)
Patient is wanting a call back from Dr. Martinique as soon as possible.  He is upset because he said someone dropped the ball and didn't draw the correct labs or someone didn't put in the correct orders so he had to come back for a second time for labs.  He said he wants to talk to Dr. Martinique before he does an evaluation of his visit to [facility].

## 2020-06-30 NOTE — Addendum Note (Signed)
Addended by: Rodrigo Ran on: 06/30/2020 09:13 AM   Modules accepted: Orders

## 2020-07-01 LAB — LIPID PANEL
Cholesterol: 183 mg/dL (ref <200)
HDL: 50 mg/dL (ref >=40)
LDL Cholesterol (Calc): 113 mg/dL (calc) — ABNORMAL HIGH
Non-HDL Cholesterol (Calc): 133 mg/dL (calc) — ABNORMAL HIGH (ref <130)
Total CHOL/HDL Ratio: 3.7 (calc) (ref <5.0)
Triglycerides: 95 mg/dL (ref <150)

## 2020-07-01 LAB — COMPLETE METABOLIC PANEL WITH GFR
AG Ratio: 1.8 (calc) (ref 1.0–2.5)
ALT: 11 U/L (ref 9–46)
AST: 14 U/L (ref 10–35)
Albumin: 4.4 g/dL (ref 3.6–5.1)
Alkaline phosphatase (APISO): 79 U/L (ref 35–144)
BUN: 18 mg/dL (ref 7–25)
CO2: 32 mmol/L (ref 20–32)
Calcium: 9.6 mg/dL (ref 8.6–10.3)
Chloride: 102 mmol/L (ref 98–110)
Creat: 1.17 mg/dL (ref 0.70–1.18)
GFR, Est African American: 72 mL/min/{1.73_m2} (ref >=60)
GFR, Est Non African American: 62 mL/min/{1.73_m2} (ref >=60)
Globulin: 2.5 g/dL (calc) (ref 1.9–3.7)
Glucose, Bld: 91 mg/dL (ref 65–99)
Potassium: 4.1 mmol/L (ref 3.5–5.3)
Sodium: 141 mmol/L (ref 135–146)
Total Bilirubin: 0.7 mg/dL (ref 0.2–1.2)
Total Protein: 6.9 g/dL (ref 6.1–8.1)

## 2020-07-01 LAB — VITAMIN D 25 HYDROXY (VIT D DEFICIENCY, FRACTURES): Vit D, 25-Hydroxy: 51 ng/mL (ref 30–100)

## 2020-07-05 NOTE — Telephone Encounter (Signed)
Labs were done and message sent through my chart. Jandel Patriarca Martinique, MD

## 2020-08-02 ENCOUNTER — Telehealth: Payer: Self-pay | Admitting: Gastroenterology

## 2020-08-02 NOTE — Telephone Encounter (Signed)
Good morning!!  Patient called cancelled 1:00pm.. Previsit Rm 50   Thanks, have a great day!!

## 2020-08-12 ENCOUNTER — Encounter: Payer: Medicare HMO | Admitting: Gastroenterology

## 2020-09-23 ENCOUNTER — Telehealth: Payer: Self-pay | Admitting: Family Medicine

## 2020-09-23 NOTE — Telephone Encounter (Signed)
Left message for patient to call back and schedule Medicare Annual Wellness Visit (AWV) either virtually or in office.   Last AWV 06/02/16  please schedule at anytime with [facility] Nurse Health Advisor 1 or 2   This should be a 45 minute visit.

## 2020-09-29 ENCOUNTER — Other Ambulatory Visit: Payer: Self-pay | Admitting: Family Medicine

## 2020-10-11 ENCOUNTER — Other Ambulatory Visit: Payer: Self-pay | Admitting: Family Medicine

## 2020-11-15 ENCOUNTER — Telehealth: Payer: Self-pay | Admitting: Family Medicine

## 2020-11-15 NOTE — Telephone Encounter (Signed)
Left message for patient to call back and schedule Medicare Annual Wellness Visit (AWV) either virtually or in office. No detailed message left   Last AWV 06/02/16  please schedule at anytime with [facility] Nurse Health Advisor 1 or 2   This should be a 45 minute visit.

## 2020-12-16 ENCOUNTER — Other Ambulatory Visit: Payer: Self-pay

## 2020-12-16 ENCOUNTER — Ambulatory Visit (AMBULATORY_SURGERY_CENTER): Payer: Self-pay | Admitting: *Deleted

## 2020-12-16 VITALS — Ht 70.0 in | Wt 160.0 lb

## 2020-12-16 DIAGNOSIS — Z1211 Encounter for screening for malignant neoplasm of colon: Secondary | ICD-10-CM

## 2020-12-16 MED ORDER — NA SULFATE-K SULFATE-MG SULF 17.5-3.13-1.6 GM/177ML PO SOLN: ORAL | 0 refills | Status: DC

## 2020-12-16 NOTE — Progress Notes (Signed)
Patient is here in-person for PV. Patient denies any allergies to eggs or soy. Patient denies any problems with anesthesia/sedation. Patient denies any oxygen use at home. Patient denies taking any diet/weight loss medications or blood thinners. Patient is not being treated for MRSA or C-diff. Patient is aware of our care-partner policy and Covid-19 safety protocol. EMMI education assigned to the patient for the procedure, sent to MyChart.   Patient is fully COVID-19 vaccinated, per patient.    

## 2020-12-24 ENCOUNTER — Encounter: Payer: Self-pay | Admitting: Gastroenterology

## 2020-12-27 ENCOUNTER — Ambulatory Visit (AMBULATORY_SURGERY_CENTER): Payer: Medicare HMO | Admitting: Gastroenterology

## 2020-12-27 ENCOUNTER — Other Ambulatory Visit: Payer: Self-pay | Admitting: Gastroenterology

## 2020-12-27 ENCOUNTER — Encounter: Payer: Self-pay | Admitting: Gastroenterology

## 2020-12-27 ENCOUNTER — Other Ambulatory Visit: Payer: Self-pay

## 2020-12-27 VITALS — BP 130/75 | HR 55 | Temp 97.5°F | Resp 15 | Ht 70.0 in | Wt 160.0 lb

## 2020-12-27 DIAGNOSIS — K635 Polyp of colon: Secondary | ICD-10-CM

## 2020-12-27 DIAGNOSIS — D123 Benign neoplasm of transverse colon: Secondary | ICD-10-CM

## 2020-12-27 DIAGNOSIS — Z1211 Encounter for screening for malignant neoplasm of colon: Secondary | ICD-10-CM | POA: Diagnosis not present

## 2020-12-27 MED ORDER — SODIUM CHLORIDE 0.9 % IV SOLN: 500.0000 mL | Freq: Once | INTRAVENOUS | Status: DC

## 2020-12-27 NOTE — Progress Notes (Signed)
Called to room to assist during endoscopic procedure.  Patient ID and intended procedure confirmed with present staff. Received instructions for my participation in the procedure from the performing physician.  

## 2020-12-27 NOTE — Patient Instructions (Signed)
YOU HAD AN ENDOSCOPIC PROCEDURE TODAY AT THE  ENDOSCOPY CENTER:   Refer to the procedure report that was given to you for any specific questions about what was found during the examination.  If the procedure report does not answer your questions, please call your gastroenterologist to clarify.  If you requested that your care partner not be given the details of your procedure findings, then the procedure report has been included in a sealed envelope for you to review at your convenience later.  YOU SHOULD EXPECT: Some feelings of bloating in the abdomen. Passage of more gas than usual.  Walking can help get rid of the air that was put into your GI tract during the procedure and reduce the bloating. If you had a lower endoscopy (such as a colonoscopy or flexible sigmoidoscopy) you may notice spotting of blood in your stool or on the toilet paper. If you underwent a bowel prep for your procedure, you may not have a normal bowel movement for a few days.  Please Note:  You might notice some irritation and congestion in your nose or some drainage.  This is from the oxygen used during your procedure.  There is no need for concern and it should clear up in a day or so.  SYMPTOMS TO REPORT IMMEDIATELY:   Following lower endoscopy (colonoscopy or flexible sigmoidoscopy):  Excessive amounts of blood in the stool  Significant tenderness or worsening of abdominal pains  Swelling of the abdomen that is new, acute  Fever of 100F or higher   Following upper endoscopy (EGD)  Vomiting of blood or coffee ground material  New chest pain or pain under the shoulder blades  Painful or persistently difficult swallowing  New shortness of breath  Fever of 100F or higher  Black, tarry-looking stools  For urgent or emergent issues, a gastroenterologist can be reached at any hour by calling [phone]. Do not use MyChart messaging for urgent concerns.    DIET:  We do recommend a small meal at first, but  then you may proceed to your regular diet.  Drink plenty of fluids but you should avoid alcoholic beverages for 24 hours.  ACTIVITY:  You should plan to take it easy for the rest of today and you should NOT DRIVE or use heavy machinery until tomorrow (because of the sedation medicines used during the test).    FOLLOW UP: Our staff will call the number listed on your records 48-72 hours following your procedure to check on you and address any questions or concerns that you may have regarding the information given to you following your procedure. If we do not reach you, we will leave a message.  We will attempt to reach you two times.  During this call, we will ask if you have developed any symptoms of COVID 19. If you develop any symptoms (ie: fever, flu-like symptoms, shortness of breath, cough etc.) before then, please call [phone].  If you test positive for Covid 19 in the 2 weeks post procedure, please call and report this information to us.    If any biopsies were taken you will be contacted by phone or by letter within the next 1-3 weeks.  Please call us at [phone] if you have not heard about the biopsies in 3 weeks.    SIGNATURES/CONFIDENTIALITY: You and/or your care partner have signed paperwork which will be entered into your electronic medical record.  These signatures attest to the fact that that the information above on   your After Visit Summary has been reviewed and is understood.  Full responsibility of the confidentiality of this discharge information lies with you and/or your care-partner. 

## 2020-12-27 NOTE — Progress Notes (Signed)
First dose of propofol given noted IV is infiltrated. New IV placed and old IV d/c'd. Heat pack applied to old site. tb

## 2020-12-27 NOTE — Progress Notes (Signed)
VS by CW  I have reviewed the patient's medical history in detail and updated the computerized patient record.  

## 2020-12-27 NOTE — Op Note (Signed)
[place] Patient Name: [name] Procedure Date: 12/27/2020 1:28 PM MRN: [id number] Endoscopist: Ladene Artist , MD Age: 70 Referring MD:  Date of Birth: 1951-07-22 Gender: Male Account #: [id number] Procedure:                Colonoscopy Indications:              Screening for colorectal malignant neoplasm Medicines:                Monitored Anesthesia Care Procedure:                Pre-Anesthesia Assessment:                           - Prior to the procedure, a History and Physical                            was performed, and patient medications and                            allergies were reviewed. The patient's tolerance of                            previous anesthesia was also reviewed. The risks                            and benefits of the procedure and the sedation                            options and risks were discussed with the patient.                            All questions were answered, and informed consent                            was obtained. Prior Anticoagulants: The patient has                            taken no previous anticoagulant or antiplatelet                            agents. ASA Grade Assessment: II - A patient with                            mild systemic disease. After reviewing the risks                            and benefits, the patient was deemed in                            satisfactory condition to undergo the procedure.                           After obtaining informed consent, the colonoscope  was passed under direct vision. Throughout the                            procedure, the patient's blood pressure, pulse, and                            oxygen saturations were monitored continuously. The                            Olympus CF-HQ190L (Serial# [id number]) Colonoscope was                            introduced through the anus and advanced to the the                            cecum,  identified by appendiceal orifice and                            ileocecal valve. The ileocecal valve, appendiceal                            orifice, and rectum were photographed. The quality                            of the bowel preparation was adequate after                            extensive lavage and suction. The colonoscopy was                            performed without difficulty. The patient tolerated                            the procedure well. Scope In: 1:43:23 PM Scope Out: 2:00:49 PM Scope Withdrawal Time: 0 hours 13 minutes 1 second  Total Procedure Duration: 0 hours 17 minutes 26 seconds  Findings:                 The perianal and digital rectal examinations were                            normal.                           A 6 mm polyp was found in the transverse colon. The                            polyp was sessile. The polyp was removed with a                            cold snare. Resection and retrieval were complete.                           Multiple medium-mouthed diverticula were found in  the entire colon. There was evidence of                            diverticular spasm. There was evidence of an                            impacted diverticulum. There was no evidence of                            diverticular bleeding.                           Internal hemorrhoids were found during                            retroflexion. The hemorrhoids were moderate and                            Grade I (internal hemorrhoids that do not prolapse).                           The exam was otherwise without abnormality on                            direct and retroflexion views. Complications:            No immediate complications. Estimated blood loss:                            None. Estimated Blood Loss:     Estimated blood loss: none. Impression:               - One 6 mm polyp in the transverse colon, removed                             with a cold snare. Resected and retrieved.                           - Moderate diverticulosis in the entire examined                            colon.                           - Internal hemorrhoids.                           - The examination was otherwise normal on direct                            and retroflexion views. Recommendation:           - Repeat colonoscopy after studies are complete for                            surveillance based on pathology results with a more  extensive bowel prep.                           - Patient has a contact number available for                            emergencies. The signs and symptoms of potential                            delayed complications were discussed with the                            patient. Return to normal activities tomorrow.                            Written discharge instructions were provided to the                            patient.                           - High fiber diet.                           - Continue present medications.                           - Await pathology results. Ladene Artist, MD 12/27/2020 2:04:40 PM This report has been signed electronically.

## 2020-12-27 NOTE — Progress Notes (Signed)
To PACU, VSS. Report to Rn.tb 

## 2020-12-29 ENCOUNTER — Telehealth: Payer: Self-pay | Admitting: *Deleted

## 2020-12-29 ENCOUNTER — Telehealth: Payer: Self-pay

## 2020-12-29 NOTE — Telephone Encounter (Signed)
  Follow up Call-  Call back number 12/27/2020  Post procedure Call Back phone  # [phone]  Permission to leave phone message Yes  Some recent data might be hidden     Patient questions:  Do you have a fever, pain , or abdominal swelling? No. Pain Score  0 *  Have you tolerated food without any problems? Yes.    Have you been able to return to your normal activities? Yes.    Do you have any questions about your discharge instructions: Diet   No. Medications  No. Follow up visit  No.  Do you have questions or concerns about your Care? No.  Actions: * If pain score is 4 or above: No action needed, pain <4.  1. Have you developed a fever since your procedure? no  2.   Have you had an respiratory symptoms (SOB or cough) since your procedure? no  3.   Have you tested positive for COVID 19 since your procedure no  4.   Have you had any family members/close contacts diagnosed with the COVID 19 since your procedure?  no   If yes to any of these questions please route to Joylene John, RN and Joella Prince, RN

## 2020-12-29 NOTE — Telephone Encounter (Signed)
Left message on follow up call. 

## 2021-01-12 ENCOUNTER — Encounter: Payer: Self-pay | Admitting: Gastroenterology

## 2021-01-19 ENCOUNTER — Telehealth: Payer: Self-pay | Admitting: Gastroenterology

## 2021-01-19 NOTE — Telephone Encounter (Signed)
Called patient and read Dr. Lynne Leader letter to him. Let him know it was sent out last week.

## 2021-01-19 NOTE — Telephone Encounter (Signed)
Inbound call from patient requesting their colonoscopy results please.

## 2021-02-02 ENCOUNTER — Telehealth: Payer: Self-pay | Admitting: Family Medicine

## 2021-02-02 NOTE — Telephone Encounter (Signed)
Spoke to patient to  schedule Medicare Annual Wellness Visit (AWV) either virtually or in office.  He stated he will call back to schedule   Last AWV 06/02/16  please schedule at anytime with [facility] Nurse Health Advisor 1 or 2   This should be a 45 minute visit.

## 2021-02-07 ENCOUNTER — Telehealth: Payer: Self-pay | Admitting: Family Medicine

## 2021-02-07 NOTE — Telephone Encounter (Signed)
I left patient a voicemail to return my call. 

## 2021-02-07 NOTE — Telephone Encounter (Signed)
Patient is calling and is requesting a call back regarding his ear. Pt declined appointment, please advise. CB is [phone]

## 2021-02-08 ENCOUNTER — Other Ambulatory Visit: Payer: Self-pay

## 2021-02-08 NOTE — Telephone Encounter (Signed)
Pt thinks he has an ear infection - appointment made with Dr. Volanda Napoleon for tomorrow by front office.

## 2021-02-08 NOTE — Telephone Encounter (Signed)
I left patient a voicemail to return my call. 

## 2021-02-09 ENCOUNTER — Ambulatory Visit: Payer: Medicare HMO | Admitting: Family Medicine

## 2021-02-10 ENCOUNTER — Telehealth: Payer: Self-pay | Admitting: Family Medicine

## 2021-02-10 ENCOUNTER — Encounter: Payer: Self-pay | Admitting: Internal Medicine

## 2021-02-10 ENCOUNTER — Other Ambulatory Visit: Payer: Self-pay

## 2021-02-10 ENCOUNTER — Ambulatory Visit (INDEPENDENT_AMBULATORY_CARE_PROVIDER_SITE_OTHER): Payer: Medicare HMO | Admitting: Internal Medicine

## 2021-02-10 VITALS — BP 158/78 | HR 63 | Temp 98.2°F | Wt 158.1 lb

## 2021-02-10 DIAGNOSIS — H6123 Impacted cerumen, bilateral: Secondary | ICD-10-CM | POA: Diagnosis not present

## 2021-02-10 DIAGNOSIS — H66002 Acute suppurative otitis media without spontaneous rupture of ear drum, left ear: Secondary | ICD-10-CM

## 2021-02-10 DIAGNOSIS — R6889 Other general symptoms and signs: Secondary | ICD-10-CM

## 2021-02-10 MED ORDER — DOXYCYCLINE HYCLATE 100 MG PO TABS: 100.0000 mg | ORAL_TABLET | Freq: Two times a day (BID) | ORAL | 0 refills | Status: AC

## 2021-02-10 MED ORDER — DOXYCYCLINE HYCLATE 100 MG PO TABS: 100.0000 mg | ORAL_TABLET | Freq: Two times a day (BID) | ORAL | 0 refills | Status: DC

## 2021-02-10 NOTE — Progress Notes (Signed)
Acute office Visit     This visit occurred during the SARS-CoV-2 public health emergency.  Safety protocols were in place, including screening questions prior to the visit, additional usage of staff PPE, and extensive cleaning of exam room while observing appropriate contact time as indicated for disinfecting solutions.    CC/Reason for Visit: Left ear pain  HPI: [name] is a 70 y.o. male who is coming in today for the above mentioned reasons.  3 days ago while taking a shower he felt water fall into it.  He used alcohol and a Q-tip as well as a "earwax candle".  He has been having pain in his left ear since.  No fever, no discharge.  No other URI symptoms.  Past Medical/Surgical History: Past Medical History:  Diagnosis Date   CVA (cerebral vascular accident) ([place]) 2019   Diverticulosis    Hyperlipidemia    Hypertension    Internal hemorrhoid    Vitamin D deficiency     Past Surgical History:  Procedure Laterality Date   COLONOSCOPY  2008   [facility]   INGUINAL HERNIA REPAIR     NASAL SEPTUM SURGERY     TONSILLECTOMY     VENTRAL HERNIA REPAIR N/A 05/08/2019   Procedure: VENTRAL HERNIA REPAIR;  Surgeon: Coralie Keens, MD;  Location: [place];  Service: General;  Laterality: N/A;  LMA    Social History:  reports that he quit smoking about 49 years ago. His smoking use included cigarettes. He started smoking about 53 years ago. He has a 2.00 pack-year smoking history. He has never used smokeless tobacco. He reports that he does not drink alcohol and does not use drugs.  Allergies: Allergies  Allergen Reactions   Penicillins Other (See Comments)    Passes out Has patient had a PCN reaction causing immediate rash, facial/tongue/throat swelling, SOB or lightheadedness with hypotension: No Has patient had a PCN reaction causing severe rash involving mucus membranes or skin necrosis: No Has patient had a PCN reaction that required hospitalization: No Has patient  had a PCN reaction occurring within the last 10 years: No If all of the above answers are "NO", then may proceed with Cephalosporin use.    Family History:  Family History  Problem Relation Age of Onset   Stroke Father    Cancer Mother        Brain   Heart attack Brother    Colon cancer Neg Hx    Esophageal cancer Neg Hx    Stomach cancer Neg Hx    Rectal cancer Neg Hx      Current Outpatient Medications:    amLODipine (NORVASC) 2.5 MG tablet, TAKE 1 TABLET IN THE MORNING AND AT BEDTIME, Disp: 180 tablet, Rfl: 3   carvedilol (COREG) 6.25 MG tablet, TAKE 1 TABLET TWICE DAILY WITH A MEAL, Disp: 180 tablet, Rfl: 3   enalapril (VASOTEC) 20 MG tablet, TAKE 1 TABLET EVERY DAY, Disp: 90 tablet, Rfl: 2   vitamin C (ASCORBIC ACID) 500 MG tablet, Take 1,000 mg by mouth daily. , Disp: , Rfl:    Vitamin D, Cholecalciferol, 10 MCG (400 UNIT) CAPS, Take 400 Units by mouth daily., Disp: , Rfl:    vitamin E 400 UNIT capsule, Take 400 Units by mouth 2 (two) times a week. , Disp: , Rfl:    doxycycline (VIBRA-TABS) 100 MG tablet, Take 1 tablet (100 mg total) by mouth 2 (two) times daily for 5 days., Disp: 10 tablet, Rfl: 0  Review of Systems:  Constitutional: Denies fever, chills, diaphoresis, appetite change and fatigue.  HEENT: Denies photophobia, eye pain, redness,  congestion, sore throat, rhinorrhea, sneezing, mouth sores, trouble swallowing, neck pain, neck stiffness and tinnitus.   Respiratory: Denies SOB, DOE, cough, chest tightness,  and wheezing.   Cardiovascular: Denies chest pain, palpitations and leg swelling.  Gastrointestinal: Denies nausea, vomiting, abdominal pain, diarrhea, constipation, blood in stool and abdominal distention.  Genitourinary: Denies dysuria, urgency, frequency, hematuria, flank pain and difficulty urinating.  Endocrine: Denies: hot or cold intolerance, sweats, changes in hair or nails, polyuria, polydipsia. Musculoskeletal: Denies myalgias, back pain, joint  swelling, arthralgias and gait problem.  Skin: Denies pallor, rash and wound.  Neurological: Denies dizziness, seizures, syncope, weakness, light-headedness, numbness and headaches.  Hematological: Denies adenopathy. Easy bruising, personal or family bleeding history  Psychiatric/Behavioral: Denies suicidal ideation, mood changes, confusion, nervousness, sleep disturbance and agitation    Physical Exam: Vitals:   02/10/21 1027  BP: (!) 158/78  Pulse: 63  Temp: 98.2 F (36.8 C)  TempSrc: Oral  SpO2: 97%  Weight: 158 lb 1.6 oz (71.7 kg)    Body mass index is 22.68 kg/m.   Constitutional: NAD, calm, comfortable Eyes: PERRL, lids and conjunctivae normal, wears corrective lenses ENMT: Mucous membranes are moist.  Very hard of hearing.  Posterior pharynx clear of any exudate or lesions. Normal dentition.  Tympanic membranes bilaterally are unable to be visualized due to collapsed, short ear canals and impacted cerumen. Psychiatric: Normal judgment and insight. Alert and oriented x 3. Normal mood.    Impression and Plan:  Acute suppurative otitis media of left ear without spontaneous rupture of tympanic membrane, recurrence not specified  Small ear canal  Bilateral impacted cerumen   -Unable to tell for sure if he has an otitis media or perforated eardrum as I am unable to visualize the tympanic membrane. -I will empirically give him doxycycline 100 mg daily for 5 days and I will refer him to ENT given his impacted cerumen and collapsed ear canals.    Patient Instructions  -Nice seeing you today!!  -Start Doxycycline 100 mg twice daily for 5 days.  -We will arrange for you to see ENT.    Lelon Frohlich, MD [facility]

## 2021-02-10 NOTE — Patient Instructions (Signed)
-  Nice seeing you today!!  -Start Doxycycline 100 mg twice daily for 5 days.  -We will arrange for you to see ENT.

## 2021-02-10 NOTE — Telephone Encounter (Signed)
Patient is calling and is requesting a call back regarding his recent visit and some questions , please advise. CB is [phone]

## 2021-02-11 NOTE — Telephone Encounter (Signed)
Left a message for the pt to return my call.  

## 2021-02-14 ENCOUNTER — Telehealth: Payer: Self-pay | Admitting: Family Medicine

## 2021-02-14 NOTE — Telephone Encounter (Signed)
Antibiotic given to patient by Dr. Jerilee Hoh was ineffective according to patient.  He wants to speak to Dr. Ledell Noss nurse, not Dr. Martinique since Dr. Jerilee Hoh was the one who treated him.

## 2021-02-15 ENCOUNTER — Telehealth: Payer: Self-pay | Admitting: Family Medicine

## 2021-02-15 NOTE — Telephone Encounter (Signed)
See phone note

## 2021-02-15 NOTE — Telephone Encounter (Signed)
Spoke with patient and he has scheduled an appointment with Dr Martinique.

## 2021-02-15 NOTE — Telephone Encounter (Signed)
Patient seen hernandez on 02/10/2021 but is Dr. Martinique patient  Patient calling again this morning and left several messages, stating he have completed the medication doxycycline and ear is not any better.   Patient wants to know if provider can call in: Taro-Ciprofloxacin-Dexamethasone ear drops for his ear.  Please call patient back at [phone]

## 2021-02-16 ENCOUNTER — Encounter: Payer: Self-pay | Admitting: Family Medicine

## 2021-02-16 ENCOUNTER — Other Ambulatory Visit: Payer: Self-pay

## 2021-02-16 ENCOUNTER — Ambulatory Visit (INDEPENDENT_AMBULATORY_CARE_PROVIDER_SITE_OTHER): Payer: Medicare HMO | Admitting: Family Medicine

## 2021-02-16 VITALS — Resp 16 | Ht 70.0 in | Wt 159.2 lb

## 2021-02-16 DIAGNOSIS — H6123 Impacted cerumen, bilateral: Secondary | ICD-10-CM | POA: Diagnosis not present

## 2021-02-16 DIAGNOSIS — H60502 Unspecified acute noninfective otitis externa, left ear: Secondary | ICD-10-CM

## 2021-02-16 DIAGNOSIS — I10 Essential (primary) hypertension: Secondary | ICD-10-CM | POA: Diagnosis not present

## 2021-02-16 MED ORDER — DEBROX 6.5 % OT SOLN
5.0000 [drp] | Freq: Two times a day (BID) | OTIC | 0 refills | Status: AC
Start: 2021-02-16 — End: ?

## 2021-02-16 MED ORDER — CIPROFLOXACIN-DEXAMETHASONE 0.3-0.1 % OT SUSP: 4.0000 [drp] | Freq: Two times a day (BID) | OTIC | 0 refills | Status: AC

## 2021-02-16 NOTE — Progress Notes (Signed)
Chief Complaint  Patient presents with   Ear Pain    Completed course of doxycycline, not doing any better   HPI: [name] is a 70 y.o. male with hx of HTN,CVD,and seasonal allergies here today complaining of 9 days of left earache. He was evaluated on 02/10/21 for this problem, started on Doxycycline. ENT referral has been placed.  He has noted minimal improvement. Pain is constant and moderate.  Problem stated after having fullness ear sensation, he used a earwax candle and got a good amount of cerumen , he tried to clean ear with a Qtip,sudden onset of earache. Decreased hearing.  He has not noted ear drainage. Denies fever, chills,changes in appetite,sore throat,cough,wheezing,nausea,or vomiting.  Exacerbated by palpation and sometimes with swallowing. Tender gland left cervical. Negative for ear lobe erythema or edema.  No recent URI or travel.  Last follow up in 05/2020. BP was elevated during his recent visit, 158/78. He does not want BP to be checked today. He is not checking BP regularly. He is on Amlodipine 2.5 mg daily,Enalapril 20 mg daily,and Carvedilol 6.25 mg bid.  Lab Results  Component Value Date   CREATININE 1.17 06/30/2020   BUN 18 06/30/2020   NA 141 06/30/2020   K 4.1 06/30/2020   CL 102 06/30/2020   CO2 32 06/30/2020   Review of Systems  Constitutional:  Negative for activity change and fatigue.  HENT:  Negative for facial swelling, mouth sores, nosebleeds and rhinorrhea.   Respiratory:  Negative for shortness of breath.   Cardiovascular:  Negative for chest pain and palpitations.  Gastrointestinal:  Negative for abdominal pain.  Musculoskeletal:  Negative for gait problem and myalgias.  Skin:  Negative for rash and wound.  Neurological:  Negative for dizziness, syncope, weakness and headaches.  Psychiatric/Behavioral:  Negative for confusion. The patient is nervous/anxious.   Rest see pertinent positives and negatives per  HPI.  Current Outpatient Medications on File Prior to Visit  Medication Sig Dispense Refill   amLODipine (NORVASC) 2.5 MG tablet TAKE 1 TABLET IN THE MORNING AND AT BEDTIME 180 tablet 3   carvedilol (COREG) 6.25 MG tablet TAKE 1 TABLET TWICE DAILY WITH A MEAL 180 tablet 3   enalapril (VASOTEC) 20 MG tablet TAKE 1 TABLET EVERY DAY 90 tablet 2   vitamin C (ASCORBIC ACID) 500 MG tablet Take 1,000 mg by mouth daily.      Vitamin D, Cholecalciferol, 10 MCG (400 UNIT) CAPS Take 400 Units by mouth daily.     vitamin E 400 UNIT capsule Take 400 Units by mouth 2 (two) times a week.      No current facility-administered medications on file prior to visit.   Past Medical History:  Diagnosis Date   CVA (cerebral vascular accident) ([place]) 2019   Diverticulosis    Hyperlipidemia    Hypertension    Internal hemorrhoid    Vitamin D deficiency    Allergies  Allergen Reactions   Penicillins Other (See Comments)    Passes out Has patient had a PCN reaction causing immediate rash, facial/tongue/throat swelling, SOB or lightheadedness with hypotension: No Has patient had a PCN reaction causing severe rash involving mucus membranes or skin necrosis: No Has patient had a PCN reaction that required hospitalization: No Has patient had a PCN reaction occurring within the last 10 years: No If all of the above answers are "NO", then may proceed with Cephalosporin use.    Social History   Socioeconomic History   Marital  status: Single    Spouse name: Not on file   Number of children: Not on file   Years of education: Not on file   Highest education level: Not on file  Occupational History   Not on file  Tobacco Use   Smoking status: Former    Packs/day: 0.50    Years: 4.00    Pack years: 2.00    Types: Cigarettes    Start date: 08/29/1967    Quit date: 08/29/1971    Years since quitting: 49.5   Smokeless tobacco: Never   Tobacco comments:    discussed AAA and agreed to fup   Vaping Use   Vaping  Use: Never used  Substance and Sexual Activity   Alcohol use: No   Drug use: No   Sexual activity: Not on file  Other Topics Concern   Not on file  Social History Narrative   Not on file   Social Determinants of Health   Financial Resource Strain: Not on file  Food Insecurity: Not on file  Transportation Needs: Not on file  Physical Activity: Not on file  Stress: Not on file  Social Connections: Not on file   Vitals:   02/16/21 1626  Resp: 16   Body mass index is 22.85 kg/m.  Physical Exam Vitals and nursing note reviewed.  Constitutional:      General: He is not in acute distress.    Appearance: He is well-developed and normal weight. He is not ill-appearing.  HENT:     Head: Normocephalic and atraumatic.     Right Ear: External ear normal.     Left Ear: External ear normal.     Ears:     Comments: Cerumen excess bilateral, cannot see TM's.Cannot appreciate if there is edema or erythema of ear canal. Left ear otoscopic examination and pressing tragus elicits pain. Decreased whisper hearing left ear.     Mouth/Throat:     Mouth: Mucous membranes are moist.  Eyes:     Conjunctiva/sclera: Conjunctivae normal.  Cardiovascular:     Rate and Rhythm: Normal rate and regular rhythm.  Pulmonary:     Effort: Pulmonary effort is normal. No respiratory distress.     Breath sounds: No stridor.  Musculoskeletal:     Cervical back: No edema or erythema.  Lymphadenopathy:     Head:     Right side of head: No submandibular, preauricular or posterior auricular adenopathy.     Left side of head: Preauricular adenopathy present. No submandibular or posterior auricular adenopathy.     Cervical: No cervical adenopathy.  Skin:    General: Skin is warm.     Findings: No erythema or rash.  Neurological:     Mental Status: He is alert and oriented to person, place, and time.  Psychiatric:        Mood and Affect: Mood is anxious.     Comments: Well groomed, good eye contact.    ASSESSMENT AND PLAN:  [name] was seen today for ear pain.  Diagnoses and all orders for this visit:  Acute otitis externa of left ear, unspecified type I can not see TM , hx and clinical findings suggest more external otitis. He does not want me trying to remove cerumen with small curette. He does not want ENT evaluation at this time. He could like to try ciprofloxacin otic drops, Ciprodex recommended. If symptoms have not improved in 48-78 hours we will consider oral cephalosporin. Clearly instructed about warning signs.  -  ciprofloxacin-dexamethasone (CIPRODEX) OTIC suspension; Place 4 drops into the left ear 2 (two) times daily for 7 days.  Bilateral impacted cerumen Avoid Qtip use. Debrox a few times per week may help with cerumen excess.  -     carbamide peroxide (DEBROX) 6.5 % OTIC solution; Place 5 drops into the right ear 2 (two) times daily. For wax.  Essential hypertension Recommend monitoring BP at home. No changes in current management.  Return if symptoms worsen or fail to improve.   Katrena Stehlin G. Martinique, MD  [facility]. [place] office.  A few things to remember from today's visit:  Acute otitis externa of left ear, unspecified type - Plan: ciprofloxacin-dexamethasone (CIPRODEX) OTIC suspension  Bilateral impacted cerumen  If you need refills please call your pharmacy. Do not use My Chart to request refills or for acute issues that need immediate attention.  Otitis Externa  Otitis externa is an infection of the outer ear canal. The outer ear canal is the area between the outside of the ear and the eardrum. Otitis externa issometimes called swimmer's ear. What are the causes? Common causes of this condition include: Swimming in dirty water. Moisture in the ear. An injury to the inside of the ear. An object stuck in the ear. A cut or scrape on the outside of the ear. What increases the risk? You are more likely to get this condition if  you go swimming often. What are the signs or symptoms? Itching in the ear. This is often the first symptom. Swelling of the ear. Redness in the ear. Ear pain. The pain may get worse when you pull on your ear. Pus coming from the ear. How is this treated? This condition may be treated with: Antibiotic ear drops. These are often given for 10-14 days. Medicines to reduce itching and swelling. Follow these instructions at home: If you were given antibiotic ear drops, use them as told by your doctor. Do not stop using them even if your condition gets better. Take over-the-counter and prescription medicines only as told by your doctor. Avoid getting water in your ears as told by your doctor. You may be told to avoid swimming or water sports for a few days. Keep all follow-up visits as told by your doctor. This is important. How is this prevented? Keep your ears dry. Use the corner of a towel to dry your ears after you swim or bathe. Try not to scratch or put things in your ear. Doing these things makes it easier for germs to grow in your ear. Avoid swimming in lakes, dirty water, or pools that may not have the right amount of a chemical called chlorine. Contact a doctor if: You have a fever. Your ear is still red, swollen, or painful after 3 days. You still have pus coming from your ear after 3 days. Your redness, swelling, or pain gets worse. You have a really bad headache. You have redness, swelling, pain, or tenderness behind your ear. Summary Otitis externa is an infection of the outer ear canal. Symptoms include pain, redness, and swelling of the ear. If you were given antibiotic ear drops, use them as told by your doctor. Do not stop using them even if your condition gets better. Try not to scratch or put things in your ear. This information is not intended to replace advice given to you by your health care provider. Make sure you discuss any questions you have with your healthcare  provider. Document Revised: 01/17/2018 Document Reviewed: 01/18/2018  Elsevier Patient Education  2022 [employer].   Please be sure medication list is accurate. If a new problem present, please set up appointment sooner than planned today.

## 2021-02-16 NOTE — Patient Instructions (Addendum)
A few things to remember from today's visit:  Acute otitis externa of left ear, unspecified type - Plan: ciprofloxacin-dexamethasone (CIPRODEX) OTIC suspension  Bilateral impacted cerumen  If you need refills please call your pharmacy. Do not use My Chart to request refills or for acute issues that need immediate attention.  Otitis Externa  Otitis externa is an infection of the outer ear canal. The outer ear canal is the area between the outside of the ear and the eardrum. Otitis externa issometimes called swimmer's ear. What are the causes? Common causes of this condition include: Swimming in dirty water. Moisture in the ear. An injury to the inside of the ear. An object stuck in the ear. A cut or scrape on the outside of the ear. What increases the risk? You are more likely to get this condition if you go swimming often. What are the signs or symptoms? Itching in the ear. This is often the first symptom. Swelling of the ear. Redness in the ear. Ear pain. The pain may get worse when you pull on your ear. Pus coming from the ear. How is this treated? This condition may be treated with: Antibiotic ear drops. These are often given for 10-14 days. Medicines to reduce itching and swelling. Follow these instructions at home: If you were given antibiotic ear drops, use them as told by your doctor. Do not stop using them even if your condition gets better. Take over-the-counter and prescription medicines only as told by your doctor. Avoid getting water in your ears as told by your doctor. You may be told to avoid swimming or water sports for a few days. Keep all follow-up visits as told by your doctor. This is important. How is this prevented? Keep your ears dry. Use the corner of a towel to dry your ears after you swim or bathe. Try not to scratch or put things in your ear. Doing these things makes it easier for germs to grow in your ear. Avoid swimming in lakes, dirty water, or pools  that may not have the right amount of a chemical called chlorine. Contact a doctor if: You have a fever. Your ear is still red, swollen, or painful after 3 days. You still have pus coming from your ear after 3 days. Your redness, swelling, or pain gets worse. You have a really bad headache. You have redness, swelling, pain, or tenderness behind your ear. Summary Otitis externa is an infection of the outer ear canal. Symptoms include pain, redness, and swelling of the ear. If you were given antibiotic ear drops, use them as told by your doctor. Do not stop using them even if your condition gets better. Try not to scratch or put things in your ear. This information is not intended to replace advice given to you by your health care provider. Make sure you discuss any questions you have with your healthcare provider. Document Revised: 01/17/2018 Document Reviewed: 01/18/2018 Elsevier Patient Education  [employer].   Please be sure medication list is accurate. If a new problem present, please set up appointment sooner than planned today.

## 2021-02-17 ENCOUNTER — Telehealth: Payer: Self-pay | Admitting: Family Medicine

## 2021-02-17 NOTE — Telephone Encounter (Signed)
The patient seen Dr. Martinique 06/22 and was given ear drops and warned him that they may not work and if they don't then for him to call back and let her know so she can send in an oral antibiotic.  Please advise

## 2021-02-18 ENCOUNTER — Telehealth: Payer: Self-pay | Admitting: Family Medicine

## 2021-02-18 ENCOUNTER — Encounter: Payer: Self-pay | Admitting: Family Medicine

## 2021-02-18 ENCOUNTER — Other Ambulatory Visit: Payer: Self-pay | Admitting: Family Medicine

## 2021-02-18 MED ORDER — CEFDINIR 300 MG PO CAPS: 300.0000 mg | ORAL_CAPSULE | Freq: Two times a day (BID) | ORAL | 0 refills | Status: AC

## 2021-02-18 MED ORDER — CEFDINIR 300 MG PO CAPS: 300.0000 mg | ORAL_CAPSULE | Freq: Two times a day (BID) | ORAL | 0 refills | Status: DC

## 2021-02-18 NOTE — Telephone Encounter (Signed)
Please refer to previous telephone encounter.

## 2021-02-18 NOTE — Telephone Encounter (Signed)
We need to give some time for the medication to start working. I think part of symptoms is caused by wax accumulation. He may need ENT to clean ear. I instructed to call if not any better after using drops for 48-72 hours; so if problem is not getting any worse, he can continue treatment and let us know Monday. Thanks, BJ

## 2021-02-18 NOTE — Telephone Encounter (Signed)
Pt informed of the message below.

## 2021-02-18 NOTE — Telephone Encounter (Signed)
I called the pt and he stated that he would return my call ing regards to the message below.

## 2021-02-18 NOTE — Telephone Encounter (Signed)
I am sending a stronger antibiotic that covers for all types of ear infections, Cefdinir. This is not a penicillin (to which he is allergic) but sometimes it can be cross reaction with other type of abx. Continue topical abx. ENT evaluation if pain is not resolved. Thanks, BJ

## 2021-02-18 NOTE — Telephone Encounter (Signed)
I spoke with the patient and he stated that he is not getting any better and it has been 48 hours since he was seen by PC. Pt stated he would like an oral Antibiotic called in as previously discussed with PCP for relief.

## 2021-02-18 NOTE — Telephone Encounter (Signed)
Pt call and stated the medication that was given to him don't work and want dr.Jordan to call him in to oral antibiotic to [place]

## 2021-02-18 NOTE — Telephone Encounter (Signed)
Patient states that he has tried the ear drops that Dr. Martinique gave him, however they are not working at all.  He said he feels the same as he did when he started them 48 hours ago.  He said that Dr. Martinique stated if the drops didn't work she would call in an oral antibiotic.  He has had 2 painful, restless nights and is scared that the weekend is coming and he needs an oral antibiotic before then.    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

## 2021-03-14 ENCOUNTER — Other Ambulatory Visit: Payer: Self-pay | Admitting: Family Medicine

## 2021-03-24 ENCOUNTER — Telehealth: Payer: Self-pay | Admitting: Family Medicine

## 2021-03-24 NOTE — Telephone Encounter (Signed)
Pt call and stated he don't want a appt.

## 2021-03-24 NOTE — Telephone Encounter (Signed)
Documented on spreadsheet 

## 2021-03-24 NOTE — Telephone Encounter (Signed)
Left message for patient to call back and schedule Medicare Annual Wellness Visit (AWV) either virtually or in office.   Last AWV 11/02/17  please schedule at anytime with [facility] Nurse Health Advisor 1 or 2   This should be a 45 minute visit.

## 2021-05-16 ENCOUNTER — Other Ambulatory Visit: Payer: Self-pay | Admitting: Family Medicine

## 2021-06-03 ENCOUNTER — Telehealth: Payer: Self-pay

## 2021-06-03 NOTE — Telephone Encounter (Signed)
We really need more information in order to be able to complete death certificate. Can you call back caller and ask if a report can be faxed to us,please.  I am going to try to reach provider who was on call at the time of [name] death. Thanks, BJ

## 2021-06-03 NOTE — Telephone Encounter (Signed)
I called and spoke with the [facility], they will send over the police report.

## 2021-06-03 NOTE — Telephone Encounter (Signed)
Caller states pt has just passed away. Caller is trying to find a primary care physician willing to sign off on the death certificate.

## 2021-06-07 NOTE — Telephone Encounter (Signed)
Police report didn't contain any information as to time of death or cause.

## 2021-06-28 DEATH — deceased
